# Patient Record
Sex: Male | Born: 1970 | Race: Black or African American | Hispanic: No | Marital: Married | Smoking: Current every day smoker
Health system: Southern US, Community
[De-identification: ages and names within clinical notes are randomized; demographics above are authoritative.]

## PROBLEM LIST (undated history)

## (undated) DIAGNOSIS — E119 Type 2 diabetes mellitus without complications: Secondary | ICD-10-CM

## (undated) DIAGNOSIS — I10 Essential (primary) hypertension: Secondary | ICD-10-CM

## (undated) DIAGNOSIS — E876 Hypokalemia: Secondary | ICD-10-CM

## (undated) DIAGNOSIS — I208 Other forms of angina pectoris: Secondary | ICD-10-CM

## (undated) DIAGNOSIS — Z720 Tobacco use: Secondary | ICD-10-CM

---

## 2002-07-28 ENCOUNTER — Encounter: Admission: RE | Admit: 2002-07-28 | Discharge: 2002-09-15 | Payer: Self-pay | Admitting: Family Medicine

## 2003-11-20 ENCOUNTER — Emergency Department (HOSPITAL_COMMUNITY): Admission: EM | Admit: 2003-11-20 | Discharge: 2003-11-20 | Payer: Self-pay | Admitting: Emergency Medicine

## 2008-04-18 ENCOUNTER — Emergency Department (HOSPITAL_COMMUNITY): Admission: EM | Admit: 2008-04-18 | Discharge: 2008-04-18 | Payer: Self-pay | Admitting: Emergency Medicine

## 2010-04-12 ENCOUNTER — Emergency Department (HOSPITAL_COMMUNITY): Admission: EM | Admit: 2010-04-12 | Discharge: 2010-04-12 | Payer: Self-pay | Admitting: Emergency Medicine

## 2011-02-20 LAB — DIFFERENTIAL
Eosinophils Absolute: 0 10*3/uL (ref 0.0–0.7)
Lymphocytes Relative: 28 % (ref 12–46)
Lymphs Abs: 2.3 10*3/uL (ref 0.7–4.0)
Neutro Abs: 5.2 10*3/uL (ref 1.7–7.7)
Neutrophils Relative %: 64 % (ref 43–77)

## 2011-02-20 LAB — CBC
HCT: 38.3 % — ABNORMAL LOW (ref 39.0–52.0)
Hemoglobin: 13.4 g/dL (ref 13.0–17.0)
MCHC: 34.9 g/dL (ref 30.0–36.0)
MCV: 92.2 fL (ref 78.0–100.0)
RBC: 4.15 MIL/uL — ABNORMAL LOW (ref 4.22–5.81)

## 2011-02-20 LAB — URINALYSIS, ROUTINE W REFLEX MICROSCOPIC
Bilirubin Urine: NEGATIVE
Hgb urine dipstick: NEGATIVE
Nitrite: NEGATIVE
Protein, ur: 30 mg/dL — AB
Urobilinogen, UA: 0.2 mg/dL (ref 0.0–1.0)

## 2011-02-20 LAB — COMPREHENSIVE METABOLIC PANEL
ALT: 37 U/L (ref 0–53)
BUN: 9 mg/dL (ref 6–23)
CO2: 26 mEq/L (ref 19–32)
Calcium: 9.8 mg/dL (ref 8.4–10.5)
Creatinine, Ser: 0.69 mg/dL (ref 0.4–1.5)
GFR calc non Af Amer: >60 mL/min (ref >60)
Glucose, Bld: 116 mg/dL — ABNORMAL HIGH (ref 70–99)

## 2011-02-20 LAB — GLUCOSE, CAPILLARY: Glucose-Capillary: 124 mg/dL — ABNORMAL HIGH (ref 70–99)

## 2011-08-29 LAB — WOUND CULTURE: Gram Stain: NONE SEEN

## 2011-12-01 ENCOUNTER — Emergency Department (HOSPITAL_COMMUNITY): Payer: BC Managed Care – PPO

## 2011-12-01 ENCOUNTER — Encounter: Payer: Self-pay | Admitting: Emergency Medicine

## 2011-12-01 ENCOUNTER — Inpatient Hospital Stay (HOSPITAL_COMMUNITY)
Admission: EM | Admit: 2011-12-01 | Discharge: 2011-12-03 | DRG: 125 | Disposition: A | Discharge status: Home / Self Care | Payer: BC Managed Care – PPO | Attending: Cardiology | Admitting: Cardiology

## 2011-12-01 ENCOUNTER — Other Ambulatory Visit: Payer: Self-pay

## 2011-12-01 DIAGNOSIS — R0789 Other chest pain: Principal | ICD-10-CM | POA: Diagnosis present

## 2011-12-01 DIAGNOSIS — I208 Other forms of angina pectoris: Secondary | ICD-10-CM

## 2011-12-01 DIAGNOSIS — I2089 Other forms of angina pectoris: Secondary | ICD-10-CM | POA: Diagnosis present

## 2011-12-01 DIAGNOSIS — I10 Essential (primary) hypertension: Secondary | ICD-10-CM | POA: Diagnosis present

## 2011-12-01 DIAGNOSIS — E876 Hypokalemia: Secondary | ICD-10-CM

## 2011-12-01 DIAGNOSIS — E119 Type 2 diabetes mellitus without complications: Secondary | ICD-10-CM

## 2011-12-01 DIAGNOSIS — F17200 Nicotine dependence, unspecified, uncomplicated: Secondary | ICD-10-CM | POA: Diagnosis present

## 2011-12-01 DIAGNOSIS — I498 Other specified cardiac arrhythmias: Secondary | ICD-10-CM | POA: Diagnosis present

## 2011-12-01 DIAGNOSIS — E785 Hyperlipidemia, unspecified: Secondary | ICD-10-CM | POA: Diagnosis present

## 2011-12-01 DIAGNOSIS — Z720 Tobacco use: Secondary | ICD-10-CM | POA: Diagnosis present

## 2011-12-01 DIAGNOSIS — R9431 Abnormal electrocardiogram [ECG] [EKG]: Secondary | ICD-10-CM

## 2011-12-01 HISTORY — DX: Hypokalemia: E87.6

## 2011-12-01 HISTORY — DX: Essential (primary) hypertension: I10

## 2011-12-01 HISTORY — DX: Type 2 diabetes mellitus without complications: E11.9

## 2011-12-01 HISTORY — DX: Other forms of angina pectoris: I20.8

## 2011-12-01 HISTORY — DX: Tobacco use: Z72.0

## 2011-12-01 LAB — CBC
Hemoglobin: 13.8 g/dL (ref 13.0–17.0)
MCH: 31.7 pg (ref 26.0–34.0)
MCHC: 36.2 g/dL — ABNORMAL HIGH (ref 30.0–36.0)

## 2011-12-01 LAB — COMPREHENSIVE METABOLIC PANEL
BUN: 9 mg/dL (ref 6–23)
Calcium: 8.8 mg/dL (ref 8.4–10.5)
GFR calc Af Amer: >90 mL/min (ref >90)
Glucose, Bld: 191 mg/dL — ABNORMAL HIGH (ref 70–99)
Total Protein: 8.2 g/dL (ref 6.0–8.3)

## 2011-12-01 LAB — BASIC METABOLIC PANEL
BUN: 9 mg/dL (ref 6–23)
Calcium: 8.6 mg/dL (ref 8.4–10.5)
GFR calc non Af Amer: >90 mL/min (ref >90)
Glucose, Bld: 201 mg/dL — ABNORMAL HIGH (ref 70–99)
Sodium: 133 mEq/L — ABNORMAL LOW (ref 135–145)

## 2011-12-01 LAB — AMYLASE: Amylase: 147 U/L — ABNORMAL HIGH (ref 0–105)

## 2011-12-01 LAB — PROTIME-INR
INR: 0.87 (ref 0.00–1.49)
Prothrombin Time: 12 seconds (ref 11.6–15.2)

## 2011-12-01 LAB — CARDIAC PANEL(CRET KIN+CKTOT+MB+TROPI)
CK, MB: 3.8 ng/mL (ref 0.3–4.0)
Troponin I: <0.3 ng/mL (ref <0.30)
Troponin I: <0.3 ng/mL (ref <0.30)

## 2011-12-01 LAB — HEPARIN LEVEL (UNFRACTIONATED): Heparin Unfractionated: <0.1 IU/mL — ABNORMAL LOW (ref 0.30–0.70)

## 2011-12-01 LAB — POCT I-STAT, CHEM 8
BUN: 10 mg/dL (ref 6–23)
Calcium, Ion: 1.05 mmol/L — ABNORMAL LOW (ref 1.12–1.32)
Creatinine, Ser: 1 mg/dL (ref 0.50–1.35)
TCO2: 24 mmol/L (ref 0–100)

## 2011-12-01 LAB — GLUCOSE, CAPILLARY
Glucose-Capillary: 144 mg/dL — ABNORMAL HIGH (ref 70–99)
Glucose-Capillary: 143 mg/dL — ABNORMAL HIGH (ref 70–99)

## 2011-12-01 LAB — APTT: aPTT: 30 seconds (ref 24–37)

## 2011-12-01 LAB — HEMOGLOBIN A1C: Mean Plasma Glucose: 134 mg/dL — ABNORMAL HIGH (ref <117)

## 2011-12-01 MED ORDER — NITROGLYCERIN 2 % TD OINT
1.0000 [in_us] | TOPICAL_OINTMENT | Freq: Three times a day (TID) | TRANSDERMAL | Status: DC
  Administered 2011-12-01 – 2011-12-03 (×7): 1 [in_us] via TOPICAL
  Filled 2011-12-01: qty 1
  Filled 2011-12-01: qty 30

## 2011-12-01 MED ORDER — POTASSIUM CHLORIDE CRYS ER 20 MEQ PO TBCR
40.0000 meq | EXTENDED_RELEASE_TABLET | Freq: Two times a day (BID) | ORAL | Status: DC
  Administered 2011-12-01 – 2011-12-03 (×5): 40 meq via ORAL
  Filled 2011-12-01 (×6): qty 2

## 2011-12-01 MED ORDER — ZOLPIDEM TARTRATE 5 MG PO TABS: 10.0000 mg | ORAL_TABLET | Freq: Every evening | ORAL | Status: DC | PRN

## 2011-12-01 MED ORDER — LISINOPRIL 20 MG PO TABS
20.0000 mg | ORAL_TABLET | Freq: Every day | ORAL | Status: DC
  Administered 2011-12-01 – 2011-12-03 (×3): 20 mg via ORAL
  Filled 2011-12-01 (×3): qty 1

## 2011-12-01 MED ORDER — ASPIRIN 300 MG RE SUPP
300.0000 mg | RECTAL | Status: AC
  Filled 2011-12-01: qty 1

## 2011-12-01 MED ORDER — ALPRAZOLAM 0.25 MG PO TABS: 0.2500 mg | ORAL_TABLET | Freq: Two times a day (BID) | ORAL | Status: DC | PRN

## 2011-12-01 MED ORDER — INSULIN ASPART 100 UNIT/ML ~~LOC~~ SOLN
0.0000 [IU] | Freq: Three times a day (TID) | SUBCUTANEOUS | Status: DC
  Administered 2011-12-01: 2 [IU] via SUBCUTANEOUS
  Administered 2011-12-01: 3 [IU] via SUBCUTANEOUS
  Administered 2011-12-02: 2 [IU] via SUBCUTANEOUS
  Administered 2011-12-02 – 2011-12-03 (×3): 3 [IU] via SUBCUTANEOUS
  Filled 2011-12-01: qty 3

## 2011-12-01 MED ORDER — HYDROCHLOROTHIAZIDE 25 MG PO TABS
25.0000 mg | ORAL_TABLET | Freq: Every day | ORAL | Status: DC
  Administered 2011-12-01 – 2011-12-03 (×3): 25 mg via ORAL
  Filled 2011-12-01 (×3): qty 1

## 2011-12-01 MED ORDER — METOPROLOL TARTRATE 25 MG PO TABS
25.0000 mg | ORAL_TABLET | Freq: Two times a day (BID) | ORAL | Status: DC
  Filled 2011-12-01 (×2): qty 1

## 2011-12-01 MED ORDER — HEPARIN SOD (PORCINE) IN D5W 100 UNIT/ML IV SOLN
2900.0000 [IU]/h | INTRAVENOUS | Status: DC
  Administered 2011-12-01: 1800 [IU]/h via INTRAVENOUS
  Administered 2011-12-01: 1500 [IU]/h via INTRAVENOUS
  Administered 2011-12-02: 2800 [IU]/h via INTRAVENOUS
  Administered 2011-12-02: 2200 [IU]/h via INTRAVENOUS
  Administered 2011-12-02: 2500 [IU]/h via INTRAVENOUS
  Administered 2011-12-03: 2800 [IU]/h via INTRAVENOUS
  Filled 2011-12-01 (×7): qty 250

## 2011-12-01 MED ORDER — SIMVASTATIN 20 MG PO TABS
20.0000 mg | ORAL_TABLET | Freq: Every day | ORAL | Status: DC
  Administered 2011-12-01 – 2011-12-02 (×2): 20 mg via ORAL
  Filled 2011-12-01 (×3): qty 1

## 2011-12-01 MED ORDER — ACETAMINOPHEN 325 MG PO TABS
650.0000 mg | ORAL_TABLET | ORAL | Status: DC | PRN
  Administered 2011-12-01 – 2011-12-02 (×2): 650 mg via ORAL
  Filled 2011-12-01 (×3): qty 2

## 2011-12-01 MED ORDER — ASPIRIN 81 MG PO CHEW: 324.0000 mg | CHEWABLE_TABLET | ORAL | Status: AC

## 2011-12-01 MED ORDER — METOPROLOL TARTRATE 50 MG PO TABS
50.0000 mg | ORAL_TABLET | Freq: Two times a day (BID) | ORAL | Status: DC
  Administered 2011-12-01 – 2011-12-03 (×5): 50 mg via ORAL
  Filled 2011-12-01 (×6): qty 1

## 2011-12-01 MED ORDER — METOPROLOL TARTRATE 1 MG/ML IV SOLN
5.0000 mg | Freq: Once | INTRAVENOUS | Status: AC
  Administered 2011-12-01: 5 mg via INTRAVENOUS
  Filled 2011-12-01: qty 5

## 2011-12-01 MED ORDER — HEPARIN BOLUS VIA INFUSION
4000.0000 [IU] | Freq: Once | INTRAVENOUS | Status: AC
  Administered 2011-12-01: 4000 [IU] via INTRAVENOUS
  Filled 2011-12-01: qty 4000

## 2011-12-01 MED ORDER — ONDANSETRON HCL 4 MG/2ML IJ SOLN
4.0000 mg | Freq: Four times a day (QID) | INTRAMUSCULAR | Status: DC | PRN
  Administered 2011-12-01: 4 mg via INTRAVENOUS
  Filled 2011-12-01: qty 2

## 2011-12-01 MED ORDER — ASPIRIN 81 MG PO CHEW
324.0000 mg | CHEWABLE_TABLET | Freq: Once | ORAL | Status: AC
  Administered 2011-12-01: 324 mg via ORAL
  Filled 2011-12-01: qty 4

## 2011-12-01 MED ORDER — HEPARIN BOLUS VIA INFUSION
3000.0000 [IU] | Freq: Once | INTRAVENOUS | Status: AC
  Administered 2011-12-01: 3000 [IU] via INTRAVENOUS
  Filled 2011-12-01: qty 3000

## 2011-12-01 MED ORDER — NITROGLYCERIN 0.4 MG SL SUBL: 0.4000 mg | SUBLINGUAL_TABLET | SUBLINGUAL | Status: DC | PRN

## 2011-12-01 MED ORDER — LISINOPRIL-HYDROCHLOROTHIAZIDE 20-25 MG PO TABS: 1.0000 | ORAL_TABLET | Freq: Every day | ORAL | Status: DC

## 2011-12-01 MED ORDER — ASPIRIN EC 81 MG PO TBEC
81.0000 mg | DELAYED_RELEASE_TABLET | Freq: Every day | ORAL | Status: DC
  Administered 2011-12-02 – 2011-12-03 (×2): 81 mg via ORAL
  Filled 2011-12-01 (×2): qty 1

## 2011-12-01 MED ORDER — SODIUM CHLORIDE 0.9 % IV SOLN
INTRAVENOUS | Status: DC
  Administered 2011-12-01 – 2011-12-02 (×3): via INTRAVENOUS

## 2011-12-01 MED ORDER — NITROGLYCERIN 0.4 MG SL SUBL
0.4000 mg | SUBLINGUAL_TABLET | SUBLINGUAL | Status: DC | PRN
  Filled 2011-12-01: qty 75

## 2011-12-01 MED ORDER — IBUPROFEN 400 MG PO TABS
400.0000 mg | ORAL_TABLET | Freq: Three times a day (TID) | ORAL | Status: DC | PRN
Start: 2011-12-01 — End: 2011-12-03
  Administered 2011-12-01: 400 mg via ORAL
  Filled 2011-12-01: qty 1

## 2011-12-01 NOTE — Progress Notes (Signed)
ANTICOAGULATION CONSULT NOTE - Initial Consult  Pharmacy Consult for Heparin Indication: ACS  No Known Allergies  Patient Measurements: 71 inches 237 pounds  Vital Signs: Temp: 98.2 F (36.8 C) (12/29 0545) Temp src: Oral (12/29 0545) BP: 136/84 mmHg (12/29 0551) Pulse Rate: 111  (12/29 0551)  Labs:  Basename 12/01/11 0424 12/01/11 0406  HGB 13.9 13.8  HCT 41.0 38.1*  PLT -- 258  APTT -- --  LABPROT -- --  INR -- --  HEPARINUNFRC -- --  CREATININE 1.00 0.60  CKTOTAL -- --  CKMB -- --  TROPONINI -- --   CrCl is unknown because there is no height on file for the current visit.  Medical History: Past Medical History  Diagnosis Date  . Hypertension   . Diabetes mellitus   . HTN (hypertension) 12/01/2011  . DM (diabetes mellitus) type 2 12/01/2011  . Tobacco abuse 12/01/2011    Medications:  Zestoretic  Metformin  Assessment: 40 yo male with chest pain for Heparin  Goal of Therapy:  Heparin level 0.3-0.7 units/ml   Plan:  Heparin 4000 units IV bolus, then 1500 units/hr Check heparin level in 6 hours.   Kyle Weiss 12/01/2011,7:24 AM

## 2011-12-01 NOTE — ED Notes (Signed)
Patient transported to X-ray 

## 2011-12-01 NOTE — H&P (Signed)
Kyle Weiss is an 40 y.o. male.   Chief Complaint: chest pain HPI: 40 year old male patient of Dr. Alwyn Ren and urgent care presents to the emergency room in the early hours of today secondary to chest pain. This discomfort started on Thursday, 11/29/2011 left anterior chest pain, more sharp, significant pain. It improved on Thursday but by Friday initially in the evening he felt fullness in his head and neck about 1:30 in the morning he developed chest pain left anterior and without radiation he had no nausea mild shortness of breath and he was diaphoretic. The pain was significant  Enough to cause him to sit up in bed. He woke his wife who brought him to the emergency room. Here in the emergency room the pain ease off of oxygen and aspirin. Currently he is pain-free.  He also related that he felt his heart was racing earlier this morning. Currently his heart rate is 112.  EKG reveals sinus tachycardia with a heart rate of 112 small Q waves in 2, 3, AVF in poor R wave progression through V4. Comparing EKG to one done 04/12/2010 Q waves in 2, 3 and AVF are deeper but poor R wave progression is new.  Patient has no known coronary artery disease but does have a history of hypertension diabetes mellitus type 2 and strong family history of premature coronary artery disease. He has unknown lipid status. Also patient has been off his hypertensive and diabetic medications for approximately 2 months he had run out and not gone back to the physician.  Past Medical History  Diagnosis Date  . Hypertension   . Diabetes mellitus   . HTN (hypertension) 12/01/2011  . DM (diabetes mellitus) type 2 12/01/2011  . Tobacco abuse 12/01/2011    History reviewed. No pertinent past surgical history.  Family History  Problem Relation Age of Onset  . Hypertension Mother   . Diabetes type II Mother   . Stroke Mother   . Coronary artery disease Father   . Hypertension Father   . Diabetes type II Father   . Stroke  Father   . Coronary artery disease Sister   . Hypertension Sister   . Diabetes type II Sister   . Cancer Brother   . Hypertension Sister   . Diabetes type II Sister   . Hypertension Brother   . Diabetes type II Brother   . Hypertension Brother   . Diabetes type II Brother    Social History:  reports that he has been smoking Cigars.  He has never used smokeless tobacco. He reports that he drinks about 11.2 ounces of alcohol per week. He reports that he does not use illicit drugs. Married, 3 children, one grandchild. 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 Allergies: No Known Allergies  Medications Prior to Admission  Medication Dose Route Frequency Provider Last Rate Last Dose  . aspirin chewable tablet 324 mg  324 mg Oral Once April K Palumbo-Rasch, MD   324 mg at 12/01/11 0554  . metoprolol (LOPRESSOR) injection 5 mg  5 mg Intravenous Once Leone Brand, NP      . nitroGLYCERIN (NITROGLYN) 2 % ointment 1 inch  1 inch Topical Q8H Leone Brand, NP      . potassium chloride SA (K-DUR,KLOR-CON) CR tablet 40 mEq  40 mEq Oral BID Leone Brand, NP      . DISCONTD: nitroGLYCERIN (NITROSTAT) SL tablet 0.4 mg  0.4 mg Sublingual  Q5 min PRN April K Palumbo-Rasch, MD       No current outpatient prescriptions on file as of 12/01/2011.  Outpatient Medications: 1. Lisinopril HCTZ 20/25 one daily none for 2 months 2. Metformin 500 mg 2 every morning, none for 2 months  Results for orders placed during the hospital encounter of 12/01/11 (from the past 48 hour(s))  CBC     Status: Abnormal   Collection Time   12/01/11  4:06 AM      Component Value Range Comment   WBC 6.6  4.0 - 10.5 (K/uL)    RBC 4.36  4.22 - 5.81 (MIL/uL)    Hemoglobin 13.8  13.0 - 17.0 (g/dL)    HCT 65.7 (*) 84.6 - 52.0 (%)    MCV 87.4  78.0 - 100.0 (fL)    MCH 31.7  26.0 - 34.0 (pg)    MCHC 36.2 (*) 30.0 - 36.0 (g/dL)    RDW 96.2  95.2 - 84.1 (%)    Platelets 258  150 - 400  (K/uL)   BASIC METABOLIC PANEL     Status: Abnormal   Collection Time   12/01/11  4:06 AM      Component Value Range Comment   Sodium 133 (*) 135 - 145 (mEq/L)    Potassium 3.2 (*) 3.5 - 5.1 (mEq/L)    Chloride 97  96 - 112 (mEq/L)    CO2 21  19 - 32 (mEq/L)    Glucose, Bld 201 (*) 70 - 99 (mg/dL)    BUN 9  6 - 23 (mg/dL)    Creatinine, Ser 3.24  0.50 - 1.35 (mg/dL)    Calcium 8.6  8.4 - 10.5 (mg/dL)    GFR calc non Af Amer >90  >90 (mL/min)    GFR calc Af Amer >90  >90 (mL/min)   POCT I-STAT TROPONIN I     Status: Normal   Collection Time   12/01/11  4:15 AM      Component Value Range Comment   Troponin i, poc 0.03  0.00 - 0.08 (ng/mL)    Comment 3            POCT I-STAT, CHEM 8     Status: Abnormal   Collection Time   12/01/11  4:24 AM      Component Value Range Comment   Sodium 137  135 - 145 (mEq/L)    Potassium 3.3 (*) 3.5 - 5.1 (mEq/L)    Chloride 106  96 - 112 (mEq/L)    BUN 10  6 - 23 (mg/dL)    Creatinine, Ser 4.01  0.50 - 1.35 (mg/dL)    Glucose, Bld 027 (*) 70 - 99 (mg/dL)    Calcium, Ion 2.53 (*) 1.12 - 1.32 (mmol/L)    TCO2 24  0 - 100 (mmol/L)    Hemoglobin 13.9  13.0 - 17.0 (g/dL)    HCT 66.4  40.3 - 47.4 (%)    Dg Chest 2 View  12/01/2011  *RADIOLOGY REPORT*  Clinical Data: Chest pain and night sweats  CHEST - 2 VIEW  Comparison: None.  Findings: Borderline heart size with normal pulmonary vascularity. No focal airspace consolidation in the lungs.  No blunting of costophrenic angles.  No pneumothorax.  Hila appear symmetrical.  IMPRESSION: No evidence of active pulmonary disease.  Original Report Authenticated By: Marlon Pel, M.D.    ROSDicky Doe.: No colds or fevers Skin: no rashes or ulcers HEENT: No blurred vision no toothaches. Cardiovascular: See history of  present illness Pulmonary: Denies shortness of breath does use tobacco and scar form G:I no diarrhea constipation or melena no indigestion GU: No hematuria or dysuria Neuro: No  syncope Endocrine: Positive for diabetes does not do glucose checks at home, has been out of medication for 2 months,   unknown if thyroid disease  Blood pressure 136/84, pulse 111, temperature 98.2 F (36.8 C), temperature source Oral, resp. rate 20, SpO2 98.00%. PE: Gen.: Alert oriented African American male in no acute distress pleasant affect. Skin: warm and dry brisk capillary refill.  HEENT: Normocephalic, sclera injected. Neck: Supple no JVD no bruits 2+ carotid upstroke. Heart: S1-S2 regular rate and rhythm somewhat tachycardic no obvious murmur gallop rub or click. Lungs: Clear without rales rhonchi or wheezes. Abdomen: Soft nontender positive bowel sounds do not palpate liver spleen or masses. Extremities: No lower extremity edema 2+ pedal pulses bilaterally 2+ radial pulses bilaterally. Neuro: Alert oriented x3 follows commands and moves all extremities.  Assessment/Plan Patient Active Problem List  Diagnoses  . Angina at rest  . HTN (hypertension)  . DM (diabetes mellitus) type 2  . Tobacco abuse   PLAN:  Admit to telemetry, serial cardiac enzymes. IV heparin. Currently patient is pain-free we'll add nitroglycerin paste only. For his hypertension we'll give IV Lopressor now and then by mouth Lopressor twice a day. We'll add sliding scale insulin for diabetes control now and check a hemoglobin A1c. Prior to discharge he will need to go back on his metformin. He Will need tobacco cessation counseling. Plan will be for either cardiac catheterization depending on cardiac enzymes versus a nuclear stress test. Dr. Herbie Baltimore we'll see him today. Patient and his wife are agreeable to this approach.  INGOLD,LAURA R 12/01/2011, 7:26 AM      Patient seen and examined. Agree with assessment and plan.  Very pleasant 40 yo AA male with cardiac risk factors including FH, DM, HTN, lipid elevation and tobacco history.  He has noticed exertional SOB for the last 1-2 months which has  increased.  He has noticed increased pulse rate and has developed recurrent CP nocturnally.  No acute changes on ECG. In light of risk factors and progressive symptoms, I have recommended definitive cardiac catheterization to delineate coronary anatomy.  Will further increase beta-blocker for improved rate control.  Lennette Bihari, MD, [facility] 12/01/2011 9:21 AM

## 2011-12-01 NOTE — ED Notes (Signed)
Family at bedside. 

## 2011-12-01 NOTE — Progress Notes (Signed)
ANTICOAGULATION CONSULT NOTE - Follow Up Consult  Pharmacy Consult for Heparin Indication: chest pain/ACS  No Known Allergies  Patient Measurements: Height: 5\' 11"  (180.3 cm) Weight: 216 lb 14.9 oz (98.4 kg) IBW/kg (Calculated) : 75.3  Adjusted Body Weight:    Vital Signs: Temp: 97.5 F (36.4 C) (12/29 1500) Temp src: Oral (12/29 1500) BP: 145/85 mmHg (12/29 1500) Pulse Rate: 77  (12/29 1500)  Labs:  Basename 12/01/11 1425 12/01/11 0718 12/01/11 0424 12/01/11 0406  HGB -- -- 13.9 13.8  HCT -- -- 41.0 38.1*  PLT -- -- -- 258  APTT -- 30 -- --  LABPROT -- 12.0 -- --  INR -- 0.87 -- --  HEPARINUNFRC <0.10* -- -- --  CREATININE -- 0.63 1.00 0.60  CKTOTAL -- 419* -- --  CKMB -- 3.8 -- --  TROPONINI -- <0.30 -- --   Estimated Creatinine Clearance: 143.8 ml/min (by C-G formula based on Cr of 0.63).   Medications:  Prescriptions prior to admission  Medication Sig Dispense Refill  . lisinopril-hydrochlorothiazide (PRINZIDE,ZESTORETIC) 20-25 MG per tablet Take 1 tablet by mouth daily.        . metFORMIN (GLUCOPHAGE) 500 MG tablet Take 500 mg by mouth.          Assessment: Admit Complaint: Awakened by CP Anticoagulation: Heparin level <0.1.  Cardiovascular: HTN.Family h/o premature CAD. Ran out of meds 2 months ago. Meds: HCTZ, lisinopril, metoprolol, NTG oint.,K,simvastatin  Endocrinology: DM. CBG 124-144 on SSI  Nephrology:ok   Goal of Therapy:  Heparin level 0.3-0.7 units/ml   Plan:  Rebolus heparin 3000 units and increase infusion to 1800 units/hr. Recheck level in 6-8 hrs.  Merilynn Finland, [employer] 12/01/2011,3:37 PM

## 2011-12-01 NOTE — ED Notes (Signed)
Patient is resting comfortably. 

## 2011-12-01 NOTE — ED Provider Notes (Signed)
History     CSN: [id number]  Arrival date & time 12/01/11  4098   First MD Initiated Contact with Patient 12/01/11 0407      Chief Complaint  Patient presents with  . Chest Pain    (Consider location/radiation/quality/duration/timing/severity/associated sxs/prior treatment) Patient is a 40 y.o. male presenting with chest pain. The history is provided by the patient and the spouse. No language interpreter was used.  Chest Pain The chest pain began 2 days ago. Duration of episode(s) is 3 hours. Chest pain occurs intermittently. The chest pain is improving. Associated with: nothing at rest. At its most intense, the pain is at 9/10. The pain is currently at 2/10. The quality of the pain is described as pressure-like. The pain radiates to the left neck and right neck. Exacerbated by: nothing. Pertinent negatives for primary symptoms include no syncope, no shortness of breath, no cough, no wheezing, no abdominal pain, no nausea and no vomiting.  Associated symptoms include diaphoresis. He tried nothing for the symptoms. Risk factors include male gender (diabetes ).  His past medical history is significant for diabetes, hyperlipidemia and hypertension.  His family medical history is significant for early MI in family.  Procedure history is negative for cardiac catheterization. Procedure history comments: thinks he had a stress test.   Thursday night awoke from sleep and lasted 3 hours.  Diaphoresis but no radiation at that time.  Friday night awoke with CP, radiation and diaphoresis but no SOB, no n/v.  PERC negative.    Past Medical History  Diagnosis Date  . Hypertension   . Diabetes mellitus     History reviewed. No pertinent past surgical history.  History reviewed. No pertinent family history.  History  Substance Use Topics  . Smoking status: Current Everyday Smoker    Types: Cigars  . Smokeless tobacco: Not on file  . Alcohol Use: Yes      Review of Systems    Constitutional: Positive for diaphoresis.  HENT: Negative for facial swelling.   Eyes: Negative for discharge.  Respiratory: Negative for cough, shortness of breath and wheezing.   Cardiovascular: Positive for chest pain. Negative for syncope.  Gastrointestinal: Negative.  Negative for nausea, vomiting and abdominal pain.  Genitourinary: Negative for difficulty urinating.  Musculoskeletal: Negative for arthralgias.  Skin: Negative.   Neurological: Negative.   Hematological: Negative.   Psychiatric/Behavioral: Negative.     Allergies  Review of patient's allergies indicates no known allergies.  Home Medications   Current Outpatient Rx  Name Route Sig Dispense Refill  . LISINOPRIL-HYDROCHLOROTHIAZIDE 20-25 MG PO TABS Oral Take 1 tablet by mouth daily.      Marland Kitchen METFORMIN HCL 500 MG PO TABS Oral Take 500 mg by mouth.        BP 136/84  Pulse 111  Temp(Src) 98.2 F (36.8 C) (Oral)  Resp 20  SpO2 98%  Physical Exam  Constitutional: He is oriented to person, place, and time. He appears well-developed and well-nourished.  HENT:  Head: Normocephalic and atraumatic.  Mouth/Throat: Oropharynx is clear and moist.  Eyes: Conjunctivae and EOM are normal. Pupils are equal, round, and reactive to light.  Neck: Normal range of motion. Neck supple.  Cardiovascular: Normal rate and regular rhythm.   Pulmonary/Chest: Effort normal and breath sounds normal. He has no wheezes.  Abdominal: Soft. Bowel sounds are normal. There is no tenderness.  Musculoskeletal: Normal range of motion.  Neurological: He is alert and oriented to person, place, and time.  Skin: Skin is  warm and dry. He is not diaphoretic.  Psychiatric: Thought content normal.    ED Course  Procedures (including critical care time)  Labs Reviewed  CBC - Abnormal; Notable for the following:    HCT 38.1 (*)    MCHC 36.2 (*)    All other components within normal limits  BASIC METABOLIC PANEL - Abnormal; Notable for the  following:    Sodium 133 (*)    Potassium 3.2 (*)    Glucose, Bld 201 (*)    All other components within normal limits  POCT I-STAT, CHEM 8 - Abnormal; Notable for the following:    Potassium 3.3 (*)    Glucose, Bld 209 (*)    Calcium, Ion 1.05 (*)    All other components within normal limits  POCT I-STAT TROPONIN I  I-STAT TROPONIN I  I-STAT, CHEM 8   Dg Chest 2 View  12/01/2011  *RADIOLOGY REPORT*  Clinical Data: Chest pain and night sweats  CHEST - 2 VIEW  Comparison: None.  Findings: Borderline heart size with normal pulmonary vascularity. No focal airspace consolidation in the lungs.  No blunting of costophrenic angles.  No pneumothorax.  Hila appear symmetrical.  IMPRESSION: No evidence of active pulmonary disease.  Original Report Authenticated By: Marlon Pel, M.D.     1. Chest pain   2. Abnormal EKG      MDM Reviewed: previous chart and vitals Interpretation: labs, ECG and x-ray Consults: cardiology    MDM   Date: 12/01/2011  Rate:112  Rhythm: sinus tachycardia  QRS Axis: normal  Intervals: normal  ST/T Wave abnormalities: nonspecific ST changes  Conduction Disutrbances:none  Narrative Interpretation: poor r wave progression IMI age undetermined  Old EKG Reviewed: changes noted   Results for orders placed during the hospital encounter of 12/01/11  CBC      Component Value Range   WBC 6.6  4.0 - 10.5 (K/uL)   RBC 4.36  4.22 - 5.81 (MIL/uL)   Hemoglobin 13.8  13.0 - 17.0 (g/dL)   HCT 16.1 (*) 09.6 - 52.0 (%)   MCV 87.4  78.0 - 100.0 (fL)   MCH 31.7  26.0 - 34.0 (pg)   MCHC 36.2 (*) 30.0 - 36.0 (g/dL)   RDW 04.5  40.9 - 81.1 (%)   Platelets 258  150 - 400 (K/uL)  BASIC METABOLIC PANEL      Component Value Range   Sodium 133 (*) 135 - 145 (mEq/L)   Potassium 3.2 (*) 3.5 - 5.1 (mEq/L)   Chloride 97  96 - 112 (mEq/L)   CO2 21  19 - 32 (mEq/L)   Glucose, Bld 201 (*) 70 - 99 (mg/dL)   BUN 9  6 - 23 (mg/dL)   Creatinine, Ser 9.14  0.50 - 1.35  (mg/dL)   Calcium 8.6  8.4 - 78.2 (mg/dL)   GFR calc non Af Amer >90  >90 (mL/min)   GFR calc Af Amer >90  >90 (mL/min)  POCT I-STAT TROPONIN I      Component Value Range   Troponin i, poc 0.03  0.00 - 0.08 (ng/mL)   Comment 3           POCT I-STAT, CHEM 8      Component Value Range   Sodium 137  135 - 145 (mEq/L)   Potassium 3.3 (*) 3.5 - 5.1 (mEq/L)   Chloride 106  96 - 112 (mEq/L)   BUN 10  6 - 23 (mg/dL)   Creatinine, Ser 9.56  0.50 - 1.35 (mg/dL)   Glucose, Bld 956 (*) 70 - 99 (mg/dL)   Calcium, Ion 2.13 (*) 1.12 - 1.32 (mmol/L)   TCO2 24  0 - 100 (mmol/L)   Hemoglobin 13.9  13.0 - 17.0 (g/dL)   HCT 08.6  57.8 - 46.9 (%)   Dg Chest 2 View  12/01/2011  *RADIOLOGY REPORT*  Clinical Data: Chest pain and night sweats  CHEST - 2 VIEW  Comparison: None.  Findings: Borderline heart size with normal pulmonary vascularity. No focal airspace consolidation in the lungs.  No blunting of costophrenic angles.  No pneumothorax.  Hila appear symmetrical.  IMPRESSION: No evidence of active pulmonary disease.  Original Report Authenticated By: Marlon Pel, M.D.          Jasmine Awe, MD 12/01/11 [phone]

## 2011-12-01 NOTE — ED Notes (Signed)
PT. REPORTS SUBSTERNAL CHEST PAIN WORSE WHEN LYING ONSET YESTERDAY , DENIES SOB ,NAUSEA OR DIAPHORESIS.  NO COUGH OR CONGESTION .

## 2011-12-02 ENCOUNTER — Other Ambulatory Visit: Payer: Self-pay

## 2011-12-02 LAB — BASIC METABOLIC PANEL
BUN: 7 mg/dL (ref 6–23)
CO2: 22 mEq/L (ref 19–32)
Calcium: 9.3 mg/dL (ref 8.4–10.5)
Creatinine, Ser: 0.62 mg/dL (ref 0.50–1.35)
Glucose, Bld: 183 mg/dL — ABNORMAL HIGH (ref 70–99)
Sodium: 133 mEq/L — ABNORMAL LOW (ref 135–145)

## 2011-12-02 LAB — CBC
HCT: 37 % — ABNORMAL LOW (ref 39.0–52.0)
Hemoglobin: 13.2 g/dL (ref 13.0–17.0)
WBC: 9.8 10*3/uL (ref 4.0–10.5)

## 2011-12-02 LAB — GLUCOSE, CAPILLARY: Glucose-Capillary: 188 mg/dL — ABNORMAL HIGH (ref 70–99)

## 2011-12-02 LAB — CARDIAC PANEL(CRET KIN+CKTOT+MB+TROPI)
CK, MB: 2.9 ng/mL (ref 0.3–4.0)
CK, MB: 2.5 ng/mL (ref 0.3–4.0)
Relative Index: 1.1 (ref 0.0–2.5)
Relative Index: 1.1 (ref 0.0–2.5)
Total CK: 273 U/L — ABNORMAL HIGH (ref 7–232)
Total CK: 220 U/L (ref 7–232)
Troponin I: <0.3 ng/mL (ref <0.30)
Troponin I: <0.3 ng/mL (ref <0.30)

## 2011-12-02 LAB — HEPARIN LEVEL (UNFRACTIONATED)
Heparin Unfractionated: <0.1 IU/mL — ABNORMAL LOW (ref 0.30–0.70)
Heparin Unfractionated: <0.1 IU/mL — ABNORMAL LOW (ref 0.30–0.70)

## 2011-12-02 LAB — LIPID PANEL
Cholesterol: 294 mg/dL — ABNORMAL HIGH (ref 0–200)
HDL: 47 mg/dL (ref >39)

## 2011-12-02 LAB — LIPASE, BLOOD: Lipase: 31 U/L (ref 11–59)

## 2011-12-02 MED ORDER — SODIUM CHLORIDE 0.9 % IV SOLN: 250.0000 mL | INTRAVENOUS | Status: DC | PRN

## 2011-12-02 MED ORDER — SODIUM CHLORIDE 0.9 % IJ SOLN
3.0000 mL | Freq: Two times a day (BID) | INTRAMUSCULAR | Status: DC
  Administered 2011-12-02: 3 mL via INTRAVENOUS

## 2011-12-02 MED ORDER — HEPARIN BOLUS VIA INFUSION
4000.0000 [IU] | Freq: Once | INTRAVENOUS | Status: AC
  Administered 2011-12-02: 4000 [IU] via INTRAVENOUS
  Filled 2011-12-02: qty 4000

## 2011-12-02 MED ORDER — HEPARIN BOLUS VIA INFUSION
3000.0000 [IU] | Freq: Once | INTRAVENOUS | Status: AC
  Administered 2011-12-02: 3000 [IU] via INTRAVENOUS
  Filled 2011-12-02: qty 3000

## 2011-12-02 MED ORDER — ASPIRIN 81 MG PO CHEW
324.0000 mg | CHEWABLE_TABLET | ORAL | Status: AC
  Administered 2011-12-03: 324 mg via ORAL
  Filled 2011-12-02: qty 4

## 2011-12-02 MED ORDER — SODIUM CHLORIDE 0.9 % IV SOLN
1.0000 mL/kg/h | INTRAVENOUS | Status: DC
Start: 2011-12-03 — End: 2011-12-03

## 2011-12-02 MED ORDER — DIAZEPAM 5 MG PO TABS
5.0000 mg | ORAL_TABLET | ORAL | Status: AC
  Administered 2011-12-03: 5 mg via ORAL
  Filled 2011-12-02: qty 1

## 2011-12-02 MED ORDER — SODIUM CHLORIDE 0.9 % IJ SOLN: 3.0000 mL | INTRAMUSCULAR | Status: DC | PRN

## 2011-12-02 NOTE — Progress Notes (Signed)
ANTICOAGULATION CONSULT NOTE - Follow Up Consult  Pharmacy Consult for Heparin Indication: chest pain/ACS  No Known Allergies  Patient Measurements: Height: 5\' 11"  (180.3 cm) Weight: 215 lb 13.3 oz (97.9 kg) IBW/kg (Calculated) : 75.3  Adjusted Body Weight:    Vital Signs: Temp: 98 F (36.7 C) (12/30 0533) Temp src: Oral (12/30 0533) BP: 128/66 mmHg (12/30 0954) Pulse Rate: 76  (12/30 0954)  Labs:  Basename 12/02/11 0957 12/02/11 0825 12/02/11 0815 12/01/11 2333 12/01/11 1425 12/01/11 0718 12/01/11 0424 12/01/11 0406  HGB -- -- -- -- -- -- 13.9 13.8  HCT -- -- -- -- -- -- 41.0 38.1*  PLT -- -- -- -- -- -- -- 258  APTT -- -- -- -- -- 30 -- --  LABPROT -- -- -- -- -- 12.0 -- --  INR -- -- -- -- -- 0.87 -- --  HEPARINUNFRC <0.10* -- -- <0.10* <0.10* -- -- --  CREATININE -- -- 0.62 -- -- 0.63 1.00 --  CKTOTAL -- 240* -- 273* 304* -- -- --  CKMB -- 2.7 -- 2.9 3.4 -- -- --  TROPONINI -- <0.30 -- <0.30 <0.30 -- -- --   Estimated Creatinine Clearance: 143.4 ml/min (by C-G formula based on Cr of 0.62).   Medications:  Prescriptions prior to admission  Medication Sig Dispense Refill  . lisinopril-hydrochlorothiazide (PRINZIDE,ZESTORETIC) 20-25 MG per tablet Take 1 tablet by mouth daily.        . metFORMIN (GLUCOPHAGE) 500 MG tablet Take 500 mg by mouth.          Assessment: Anticoagulation Heparin started for ACS. Hep level remain <0.1. No CBC noted today.  Cardiovascular h/o HTN Cath on Monday. Max BP 145/85 with HR 72-100.  Meds: ASA, HCTZ, lisinopril, metoprolol, NTG ointment, K, simvastatin.  Endocrinology h/o DM. Hgb A1C good 6.3. CBGs 122-183 on SSI.  Nephrology: Scr 0.62  Goal of Therapy:  Heparin level 0.3-0.7 units/ml   Plan:  Heparin 3000 unit bolus and increase infusion to 2500 units/hr. Recheck heparin level in 6 hrs.  Misty Stanley Stillinger 12/02/2011,12:09 PM

## 2011-12-02 NOTE — Progress Notes (Signed)
ANTICOAGULATION CONSULT NOTE - Follow Up Consult  Pharmacy Consult for Heparin Indication: chest pain/ACS  No Known Allergies  Patient Measurements: Height: 5\' 11"  (180.3 cm) Weight: 216 lb 14.9 oz (98.4 kg) IBW/kg (Calculated) : 75.3   Vital Signs: Temp: 98 F (36.7 C) (12/29 2036) Temp src: Oral (12/29 2036) BP: 129/70 mmHg (12/29 2036) Pulse Rate: 85  (12/29 2036)  Labs:  Basename 12/01/11 2333 12/01/11 1425 12/01/11 0718 12/01/11 0424 12/01/11 0406  HGB -- -- -- 13.9 13.8  HCT -- -- -- 41.0 38.1*  PLT -- -- -- -- 258  APTT -- -- 30 -- --  LABPROT -- -- 12.0 -- --  INR -- -- 0.87 -- --  HEPARINUNFRC <0.10* <0.10* -- -- --  CREATININE -- -- 0.63 1.00 0.60  CKTOTAL -- 304* 419* -- --  CKMB -- 3.4 3.8 -- --  TROPONINI -- <0.30 <0.30 -- --   Estimated Creatinine Clearance: 143.8 ml/min (by C-G formula based on Cr of 0.63).  Assessment: 40 yo male with chest pain for Heparin  Goal of Therapy:  Heparin level 0.3-0.7 units/ml   Plan:  Rebolus heparin 3000 units and increase infusion to 2200 units/hr. Recheck level in 6 hrs.  Roman Dubuc, Gary Fleet 12/02/2011,2:56 AM

## 2011-12-02 NOTE — Progress Notes (Signed)
ANTICOAGULATION CONSULT NOTE - Follow Up Consult  Pharmacy Consult for Heparin Indication: chest pain/ACS  No Known Allergies  Patient Measurements: Height: 5\' 11"  (180.3 cm) Weight: 215 lb 13.3 oz (97.9 kg) IBW/kg (Calculated) : 75.3  Adjusted Body Weight:    Vital Signs: Temp: 97.8 F (36.6 C) (12/30 1500) Temp src: Oral (12/30 1500) BP: 138/92 mmHg (12/30 1500) Pulse Rate: 79  (12/30 1500)  Labs:  Basename 12/02/11 1801 12/02/11 1454 12/02/11 0957 12/02/11 0825 12/02/11 0815 12/01/11 2333 12/01/11 0718 12/01/11 0424 12/01/11 0406  HGB 13.2 -- -- -- -- -- -- 13.9 --  HCT 37.0* -- -- -- -- -- -- 41.0 38.1*  PLT 273 -- -- -- -- -- -- -- 258  APTT -- -- -- -- -- -- 30 -- --  LABPROT -- -- -- -- -- -- 12.0 -- --  INR -- -- -- -- -- -- 0.87 -- --  HEPARINUNFRC <0.10* -- <0.10* -- -- <0.10* -- -- --  CREATININE -- -- -- -- 0.62 -- 0.63 1.00 --  CKTOTAL -- 220 -- 240* -- 273* -- -- --  CKMB -- 2.5 -- 2.7 -- 2.9 -- -- --  TROPONINI -- <0.30 -- <0.30 -- <0.30 -- -- --   Estimated Creatinine Clearance: 143.4 ml/min (by C-G formula based on Cr of 0.62).   Medications:  Prescriptions prior to admission  Medication Sig Dispense Refill  . lisinopril-hydrochlorothiazide (PRINZIDE,ZESTORETIC) 20-25 MG per tablet Take 1 tablet by mouth daily.        . metFORMIN (GLUCOPHAGE) 500 MG tablet Take 500 mg by mouth.          Assessment: Anticoagulation Heparin started for ACS. Hep level remain <0.1.   Goal of Therapy:  Heparin level 0.3-0.7 units/ml   Plan:  Heparin 4000 unit bolus and increase infusion to 2800 units/hr. Recheck heparin level in 6 hrs.  Merilynn Finland, [employer] 12/02/2011,7:12 PM

## 2011-12-02 NOTE — Progress Notes (Signed)
Subjective: No SOB, No chest Pain.  CK elevated but not MB or Troponin. Abnormal amylase on admit, have ordered one for this am? In process  Objective: Vital signs in last 24 hours: Temp:  [97.5 F (36.4 C)-98 F (36.7 C)] 98 F (36.7 C) (12/30 0533) Pulse Rate:  [72-100] 72  (12/30 0533) Resp:  [18-19] 18  (12/30 0533) BP: (129-145)/(70-85) 134/80 mmHg (12/30 0533) SpO2:  [94 %-98 %] 98 % (12/30 0533) Weight:  [97.9 kg (215 lb 13.3 oz)-98.4 kg (216 lb 14.9 oz)] 215 lb 13.3 oz (97.9 kg) (12/30 4696) Weight change:  Last BM Date: 11/30/11 Intake/Output from previous day:   Intake/Output this shift:    PE: General: A&O X 3 Pleasant affect Heart:S1S2 RRR, no mumur gallop rub or click. Lungs:clear without rales, rhonchi or wheezes Abd:+ BS soft, non tender. Ext:no edema, 2+ pedal pulses.  Labs pending.   Lab Results:  Basename 12/01/11 0424 12/01/11 0406  WBC -- 6.6  HGB 13.9 13.8  HCT 41.0 38.1*  PLT -- 258   BMET  Basename 12/01/11 0718 12/01/11 0424 12/01/11 0406  NA 138 137 --  K 3.8 3.3* --  CL 98 106 --  CO2 22 -- 21  GLUCOSE 191* 209* --  BUN 9 10 --  CREATININE 0.63 1.00 --  CALCIUM 8.8 -- 8.6    Basename 12/01/11 2333 12/01/11 1425  TROPONINI <0.30 <0.30    No results found for this basename: CHOL, HDL, LDLCALC, LDLDIRECT, TRIG, CHOLHDL   Lab Results  Component Value Date   HGBA1C 6.3* 12/01/2011     Lab Results  Component Value Date   TSH 1.973 12/01/2011    Hepatic Function Panel  Basename 12/01/11 0718  PROT 8.2  ALBUMIN 3.9  AST 80*  ALT 61*  ALKPHOS 80  BILITOT 0.3  BILIDIR --  IBILI --   No results found for this basename: CHOL in the last 72 hours No results found for this basename: PROTIME in the last 72 hours    EKG: Orders placed during the hospital encounter of 12/01/11  . ED EKG  . ED EKG  . EKG 12-LEAD  . EKG 12-LEAD  . EKG 12-LEAD    Studies/Results: Dg Chest 2 View  12/01/2011  *RADIOLOGY REPORT*   Clinical Data: Chest pain and night sweats  CHEST - 2 VIEW  Comparison: None.  Findings: Borderline heart size with normal pulmonary vascularity. No focal airspace consolidation in the lungs.  No blunting of costophrenic angles.  No pneumothorax.  Hila appear symmetrical.  IMPRESSION: No evidence of active pulmonary disease.  Original Report Authenticated By: Marlon Pel, M.D.    Medications: I have reviewed the patient's current medications.  Assessment/Plan: Patient Active Problem List  Diagnoses  . Angina at rest  . HTN (hypertension)  . DM (diabetes mellitus) type 2  . Tobacco abuse  . Hypokalemia   PLAN:  Continues with NTG paste.  Plan for cath in am. EKG SR without acute changes.  LOS: 1 day   INGOLD,LAURA R 12/02/2011, 8:37 AM       Patient seen and examined. Agree with assessment and plan.  No further CP since admission on meds.  LFT's mildly elevated; will hold simvastatin: also pt admits to increased etoh over the holidays.  ECG today shows PRWP v1-4 with T inversion V6 and small Q wave in III. Plan definitive cardiac cath in am. K replete.  Discussed procedure in detail with pt and wife.   Maisie Fus  A. Tresa Endo, MD, [facility] 12/02/2011 9:28 AM

## 2011-12-03 ENCOUNTER — Encounter (HOSPITAL_COMMUNITY)
Admission: EM | Disposition: A | Discharge status: Home / Self Care | Payer: Self-pay | Source: Home / Self Care | Attending: Cardiology

## 2011-12-03 ENCOUNTER — Encounter (HOSPITAL_COMMUNITY): Payer: Self-pay | Admitting: Cardiology

## 2011-12-03 HISTORY — PX: LEFT HEART CATHETERIZATION WITH CORONARY ANGIOGRAM: SHX5451

## 2011-12-03 LAB — CARDIAC PANEL(CRET KIN+CKTOT+MB+TROPI)
CK, MB: 1.9 ng/mL (ref 0.3–4.0)
Relative Index: 1.2 (ref 0.0–2.5)
Total CK: 189 U/L (ref 7–232)
Total CK: 157 U/L (ref 7–232)
Troponin I: <0.3 ng/mL (ref <0.30)

## 2011-12-03 LAB — CBC
HCT: 36.8 % — ABNORMAL LOW (ref 39.0–52.0)
Hemoglobin: 13 g/dL (ref 13.0–17.0)
RBC: 4.16 MIL/uL — ABNORMAL LOW (ref 4.22–5.81)
WBC: 8.1 10*3/uL (ref 4.0–10.5)

## 2011-12-03 LAB — BASIC METABOLIC PANEL
BUN: 10 mg/dL (ref 6–23)
Calcium: 9.3 mg/dL (ref 8.4–10.5)
GFR calc non Af Amer: >90 mL/min (ref >90)
Glucose, Bld: 143 mg/dL — ABNORMAL HIGH (ref 70–99)

## 2011-12-03 LAB — POCT ACTIVATED CLOTTING TIME: Activated Clotting Time: 94 s

## 2011-12-03 SURGERY — LEFT HEART CATHETERIZATION WITH CORONARY ANGIOGRAM
Anesthesia: LOCAL

## 2011-12-03 MED ORDER — LIDOCAINE HCL (PF) 1 % IJ SOLN
INTRAMUSCULAR | Status: AC
  Filled 2011-12-03: qty 30

## 2011-12-03 MED ORDER — SODIUM CHLORIDE 0.9 % IV SOLN
1.0000 mL/kg/h | INTRAVENOUS | Status: DC
  Administered 2011-12-03: 1 mL/kg/h via INTRAVENOUS

## 2011-12-03 MED ORDER — DIAZEPAM 5 MG PO TABS: 5.0000 mg | ORAL_TABLET | Freq: Four times a day (QID) | ORAL | Status: DC | PRN

## 2011-12-03 MED ORDER — ONDANSETRON HCL 4 MG/2ML IJ SOLN: 4.0000 mg | Freq: Four times a day (QID) | INTRAMUSCULAR | Status: DC | PRN

## 2011-12-03 MED ORDER — MIDAZOLAM HCL 2 MG/2ML IJ SOLN
INTRAMUSCULAR | Status: AC
  Filled 2011-12-03: qty 2

## 2011-12-03 MED ORDER — HEPARIN (PORCINE) IN NACL 2-0.9 UNIT/ML-% IJ SOLN
INTRAMUSCULAR | Status: AC
  Filled 2011-12-03: qty 2000

## 2011-12-03 MED ORDER — NITROGLYCERIN 0.2 MG/ML ON CALL CATH LAB
INTRAVENOUS | Status: AC
  Filled 2011-12-03: qty 1

## 2011-12-03 MED ORDER — METOPROLOL TARTRATE 50 MG PO TABS: 50.0000 mg | ORAL_TABLET | Freq: Two times a day (BID) | ORAL | Status: DC

## 2011-12-03 MED ORDER — FENTANYL CITRATE 0.05 MG/ML IJ SOLN
INTRAMUSCULAR | Status: AC
Start: 2011-12-03 — End: 2011-12-03
  Filled 2011-12-03: qty 2

## 2011-12-03 MED ORDER — PANTOPRAZOLE SODIUM 40 MG PO TBEC: 40.0000 mg | DELAYED_RELEASE_TABLET | Freq: Every day | ORAL | Status: DC

## 2011-12-03 NOTE — Progress Notes (Signed)
Pt says he's done with smoking. He was smoking 2 black and mild cigars. Pt in action stage. Referred to 1-800 quit now for f/u and support. Discussed oral fixation substitutes, second hand smoke and in home smoking policy. Reviewed and gave pt Written education/contact information.

## 2011-12-03 NOTE — Op Note (Signed)
Kyle Weiss is a 40 y.o. male    [id number]  [id number] LOCATION:  FACILITY: [facility]  PHYSICIAN: Lennette Bihari, M.D., [facility] 03-12-1971   DATE OF PROCEDURE:  12/03/2011   [facility]  CARDIAC CATHETERIZATION    40 yo AA male with multiple significant cardiac risk factors who was admitted with recurrent episodes of chest pain.  ECG has shown  PRWP with T wave inversion in V5 and small inferior Q waves. Definitive cardiac catherization was recommended.   PROCEDURE DESCRIPTION:   The patient was brought to the second floor  [place] [facility]iac cath lab in the postabsorptive state. He was premedicated with 2 mg versed and 50 micrograms of fentanyl His Right groin was prepped and shaved in usual sterile fashion. Xylocaine 1% was used  for local anesthesia. A 5 French sheath was inserted into the Right Femoral Artery  using standard Seldinger technique. HEMODYNAMICS:    AO SYSTOLIC/AO DIASTOLIC:  125/93   LV SYSTOLIC/LV DIASTOLIC: 125/8/18  ANGIOGRAPHIC RESULTS:   1. Left main; normal 2. LAD; normal 3. Left circumflex; normal  4. Right coronary artery; normal, large dominant vessel 5.  Left ventriculography; RAO left ventriculogram was performed using  25 mL of Visipaque dye at 12 mL/second. The overall LVEF estimated  55 - 60 %;  No wall motion abnormalities.     IMPRESSION: Normal LV function   Normal coronary arteries.  Lennette Bihari, MD, [facility] 12/03/2011 11:46 AM

## 2011-12-03 NOTE — Progress Notes (Signed)
ANTICOAGULATION CONSULT NOTE - Follow Up Consult  Pharmacy Consult for Heparin Indication: Chest Pain/ACS  No Known Allergies  Patient Measurements: Height: 5\' 11"  (180.3 cm) Weight: 215 lb 13.3 oz (97.9 kg) IBW/kg (Calculated) : 75.3  Adjusted Body Weight:    Vital Signs: Temp: 97.6 F (36.4 C) (12/30 2354) Temp src: Oral (12/30 2354) BP: 131/84 mmHg (12/30 2354) Pulse Rate: 74  (12/30 2354)  Labs:  Alvira Philips 12/03/11 0324 12/03/11 0323 12/02/11 2353 12/02/11 1801 12/02/11 1454 12/02/11 0957 12/02/11 0825 12/02/11 0815 12/01/11 0718 12/01/11 0424 12/01/11 0406  HGB 13.0 -- -- 13.2 -- -- -- -- -- -- --  HCT 36.8* -- -- 37.0* -- -- -- -- -- 41.0 --  PLT 275 -- -- 273 -- -- -- -- -- -- 258  APTT -- -- -- -- -- -- -- -- 30 -- --  LABPROT -- -- -- -- -- -- -- -- 12.0 -- --  INR -- -- -- -- -- -- -- -- 0.87 -- --  HEPARINUNFRC -- 0.24* -- <0.10* -- <0.10* -- -- -- -- --  CREATININE 0.72 -- -- -- -- -- -- 0.62 0.63 -- --  CKTOTAL -- -- 189 -- 220 -- 240* -- -- -- --  CKMB -- -- 2.1 -- 2.5 -- 2.7 -- -- -- --  TROPONINI -- -- <0.30 -- <0.30 -- <0.30 -- -- -- --   Estimated Creatinine Clearance: 143.4 ml/min (by C-G formula based on Cr of 0.72).   Medications:  Prescriptions prior to admission  Medication Sig Dispense Refill  . lisinopril-hydrochlorothiazide (PRINZIDE,ZESTORETIC) 20-25 MG per tablet Take 1 tablet by mouth daily.        . metFORMIN (GLUCOPHAGE) 500 MG tablet Take 500 mg by mouth.          Assessment: Heparin level still slightly below desired goal range of 0.3-0.7 at 0.24.   Goal of Therapy:  Heparin level 0.3-0.7 units/ml   Plan:  Will increase heparin to 2900 units/hr. Recheck heparin level in 6 hrs.   Weyman Pedro D., MS Clinical Pharmacist [phone] 12/03/2011,4:58 AM

## 2011-12-03 NOTE — Progress Notes (Signed)
[facility] Progress Note  Subjective:  No further cp  Objective:   Vital Signs in the last 24 hours: Temp:  [97.6 F (36.4 C)-98.4 F (36.9 C)] 98.4 F (36.9 C) (12/31 0518) Pulse Rate:  [71-82] 71  (12/31 0518) Resp:  [19-21] 19  (12/31 0518) BP: (128-150)/(66-98) 142/93 mmHg (12/31 0518) SpO2:  [96 %-97 %] 97 % (12/31 0518)  Intake/Output from previous day:    Scheduled:   . aspirin  324 mg Oral NOW   Or  . aspirin  300 mg Rectal NOW  . aspirin  324 mg Oral Pre-Cath  . aspirin EC  81 mg Oral Daily  . diazepam  5 mg Oral On Call  . heparin  3,000 Units Intravenous Once  . heparin  4,000 Units Intravenous Once  . lisinopril  20 mg Oral Daily   And  . hydrochlorothiazide  25 mg Oral Daily  . insulin aspart  0-15 Units Subcutaneous TID WC  . metoprolol tartrate  50 mg Oral BID  . nitroGLYCERIN  1 inch Topical Q8H  . potassium chloride  40 mEq Oral BID  . simvastatin  20 mg Oral q1800  . sodium chloride  3 mL Intravenous Q12H    Physical Exam:   General appearance: alert, cooperative and no distress Neck: no adenopathy, no carotid bruit, no JVD, supple, symmetrical, trachea midline and thyroid not enlarged, symmetric, no tenderness/mass/nodules Lungs: clear to auscultation bilaterally Heart: S1, S2 normal Abdomen: soft, non-tender; bowel sounds normal; no masses,  no organomegaly Extremities: extremities normal, atraumatic, no cyanosis or edema Pulses: 2+ and symmetric   Rate: 65  Rhythm: sinus  Lab Results:   [facility] 12/03/11 0324 12/02/11 0815  NA 134* 133*  K 3.6 3.4*  CL 99 96  CO2 25 22  GLUCOSE 143* 183*  BUN 10 7  CREATININE 0.72 0.62    Basename 12/02/11 2353 12/02/11 1454  TROPONINI <0.30 <0.30  CK elevated   Basename 12/01/11 0718  PROT 8.2  ALBUMIN 3.9  AST 80*  ALT 61*  ALKPHOS 80  BILITOT 0.3  BILIDIR --  IBILI --   Cholesterol    Assessment/Plan:   Principal Problem:  *Angina at  rest Active Problems:  HTN (hypertension)  DM (diabetes mellitus) type 2  Tobacco abuse  Hypokalemia    For diagnostic cath today with possible PVI; discussed with pt and wife.  Lennette Bihari, MD, [facility] 12/03/2011, 8:23 AM

## 2011-12-03 NOTE — Progress Notes (Signed)
Pt discharged to home with family after all post-cath orders complete.  bandaid applied to level zero right groin site.  Prescriptions given, questions answered, verbalized understanding DC instructions and f/u appointments in place.

## 2011-12-05 ENCOUNTER — Encounter (HOSPITAL_COMMUNITY): Payer: Self-pay | Admitting: Cardiology

## 2011-12-05 NOTE — Discharge Summary (Signed)
Physician Discharge Summary  Patient ID: Kyle Weiss MRN: [id number] DOB/AGE: July 03, 1971 41 y.o.  Admit date: 12/01/2011 Discharge date: 12/05/2011  Time of dictation Pt. Discharged 01/02/11  Discharge Diagnoses:  Principal Problem:  *Angina at rest- non cardiac, by cardiac cath, 11/30/11, pain resolved Active Problems:  HTN (hypertension)  DM (diabetes mellitus) type 2  Hypokalemia  Tobacco abuse   Discharged Condition: good  Hospital Course: a 41 year old male patient presented to the emergency room in the early morning hours of 12/01/2011 secondary to chest pain. The discomfort started on 11/29/2011, described as left anterior chest pain more sharp, significant pain. He improved on Thursday but by Friday he initially felt a fullness in his head neck and then at 1:30 the morning he developed chest pain without radiation. He denied nausea or vomiting he had mild shortness of breath and diaphoresis. The pain was significant enough to cause him to sit up in bed, then his wife brought him to the emergency room. The pain resolved after he had oxygen and aspirin. On initial exam he was pain-free he also stated his heart has been racing earlier that morning the ER his heart rate was 112.    Nitroglycerin paste was added he was also given IV Lopressor and then started on by mouth Lopressor. He has a history of diabetes and hypertension he missed several days of this medication. His metformin was held in the hospital.  Dr. Tresa Endo saw and assessed him and felt cardiac catheterization would be the most prudent study. Plans were for cardiac catheterization.  Cardiac enzymes were negative patient stabilized and underwent cardiac catheterization 12/03/2011. He was found to have normal coronary arteries and an EF of 55-60%..  He was stable and post procedure he had no complications. He completed is [place] was able to ambulate in the hall without complications was stable and ready for discharge  home.  We did add by mouth Lopressor to his outpatient medications annual followup with Dr. Tresa Endo.    Consults: none  Significant Diagnostic Studies:  Sodium 134 potassium 3.6 chloride 99 CO2 25 BUN 10 creatinine 0.72 calcium 9.3 glucose 143  Cardiac enzymes troponin I less than 0.30x3  CK 157-189, MB 2.1 -- 1.9  Hemoglobin 13 hematocrit 36.8 WBC 8.1 and platelets 275  Total cholesterol 294 triglycerides 777 HDL 47 HDL 47 unable to determine LDL  Hemoglobin A1c 6.3, TSH 1.973.  Portable chest x-ray:No evidence of active pulmonary disease   Discharge Exam: Blood pressure 154/98, pulse 70, temperature 98.7 F (37.1 C), temperature source Oral, resp. rate 18, height 5\' 11"  (1.803 m), weight 97.9 kg (215 lb 13.3 oz), SpO2 99.00%. General appearance: alert, cooperative and no distress  Neck: no adenopathy, no carotid bruit, no JVD, supple, symmetrical, trachea midline and thyroid not enlarged, symmetric, no tenderness/mass/nodules  Lungs: clear to auscultation bilaterally  Heart: S1, S2 normal  Abdomen: soft, non-tender; bowel sounds normal; no masses, no organomegaly  Extremities: extremities normal, atraumatic, no cyanosis or edema  Pulses: 2+ and symmetric     Disposition: Home or Self Care   Discharge Medication List as of 12/03/2011  4:18 PM    START taking these medications   Details  metoprolol (LOPRESSOR) 50 MG tablet Take 1 tablet (50 mg total) by mouth 2 (two) times daily., Starting 12/03/2011, Until Tue 12/02/12, Print    pantoprazole (PROTONIX) 40 MG tablet Take 1 tablet (40 mg total) by mouth daily., Starting 12/03/2011, Until Tue 12/02/12, Print      CONTINUE these medications  which have NOT CHANGED   Details  lisinopril-hydrochlorothiazide (PRINZIDE,ZESTORETIC) 20-25 MG per tablet Take 1 tablet by mouth daily.  , Until Discontinued, Historical Med    metFORMIN (GLUCOPHAGE) 500 MG tablet Take 500 mg by mouth.  , Until Discontinued, Historical Med        Follow-up Information    Follow up with Lennette Bihari, MD on 12/25/2011. ( @ 3:45)    Contact information:   [address] [place] [address] [phone]          Signed: Leone Brand 12/05/2011, 1:38 PM

## 2013-01-25 ENCOUNTER — Emergency Department (HOSPITAL_COMMUNITY): Payer: BC Managed Care – PPO

## 2013-01-25 ENCOUNTER — Encounter (HOSPITAL_COMMUNITY): Payer: Self-pay | Admitting: *Deleted

## 2013-01-25 ENCOUNTER — Emergency Department (HOSPITAL_COMMUNITY)
Admission: EM | Admit: 2013-01-25 | Discharge: 2013-01-25 | Disposition: A | Discharge status: Home / Self Care | Payer: BC Managed Care – PPO | Attending: Emergency Medicine | Admitting: Emergency Medicine

## 2013-01-25 DIAGNOSIS — Z91199 Patient's noncompliance with other medical treatment and regimen due to unspecified reason: Secondary | ICD-10-CM | POA: Insufficient documentation

## 2013-01-25 DIAGNOSIS — R9431 Abnormal electrocardiogram [ECG] [EKG]: Secondary | ICD-10-CM | POA: Insufficient documentation

## 2013-01-25 DIAGNOSIS — F17200 Nicotine dependence, unspecified, uncomplicated: Secondary | ICD-10-CM | POA: Insufficient documentation

## 2013-01-25 DIAGNOSIS — R0789 Other chest pain: Secondary | ICD-10-CM | POA: Insufficient documentation

## 2013-01-25 DIAGNOSIS — R0602 Shortness of breath: Secondary | ICD-10-CM | POA: Insufficient documentation

## 2013-01-25 DIAGNOSIS — I10 Essential (primary) hypertension: Secondary | ICD-10-CM | POA: Insufficient documentation

## 2013-01-25 DIAGNOSIS — K30 Functional dyspepsia: Secondary | ICD-10-CM

## 2013-01-25 DIAGNOSIS — Z9119 Patient's noncompliance with other medical treatment and regimen: Secondary | ICD-10-CM | POA: Insufficient documentation

## 2013-01-25 DIAGNOSIS — K219 Gastro-esophageal reflux disease without esophagitis: Secondary | ICD-10-CM | POA: Insufficient documentation

## 2013-01-25 DIAGNOSIS — E785 Hyperlipidemia, unspecified: Secondary | ICD-10-CM | POA: Insufficient documentation

## 2013-01-25 DIAGNOSIS — R079 Chest pain, unspecified: Secondary | ICD-10-CM

## 2013-01-25 DIAGNOSIS — Z79899 Other long term (current) drug therapy: Secondary | ICD-10-CM | POA: Insufficient documentation

## 2013-01-25 DIAGNOSIS — E119 Type 2 diabetes mellitus without complications: Secondary | ICD-10-CM | POA: Insufficient documentation

## 2013-01-25 DIAGNOSIS — R110 Nausea: Secondary | ICD-10-CM | POA: Insufficient documentation

## 2013-01-25 DIAGNOSIS — Z8679 Personal history of other diseases of the circulatory system: Secondary | ICD-10-CM | POA: Insufficient documentation

## 2013-01-25 LAB — BASIC METABOLIC PANEL
CO2: 22 mEq/L (ref 19–32)
Chloride: 103 mEq/L (ref 96–112)
GFR calc non Af Amer: >90 mL/min (ref >90)
Glucose, Bld: 221 mg/dL — ABNORMAL HIGH (ref 70–99)
Potassium: 3.4 mEq/L — ABNORMAL LOW (ref 3.5–5.1)
Sodium: 137 mEq/L (ref 135–145)

## 2013-01-25 LAB — CBC
Hemoglobin: 14.2 g/dL (ref 13.0–17.0)
MCH: 31.6 pg (ref 26.0–34.0)
MCV: 88 fL (ref 78.0–100.0)
RBC: 4.49 MIL/uL (ref 4.22–5.81)

## 2013-01-25 LAB — TROPONIN I: Troponin I: <0.3 ng/mL (ref <0.30)

## 2013-01-25 MED ORDER — METFORMIN HCL 500 MG PO TABS: 500.0000 mg | ORAL_TABLET | Freq: Two times a day (BID) | ORAL | Status: DC

## 2013-01-25 MED ORDER — PANTOPRAZOLE SODIUM 40 MG IV SOLR
40.0000 mg | Freq: Once | INTRAVENOUS | Status: AC
  Administered 2013-01-25: 40 mg via INTRAVENOUS
  Filled 2013-01-25: qty 40

## 2013-01-25 MED ORDER — SODIUM CHLORIDE 0.9 % IV BOLUS (SEPSIS): 1000.0000 mL | Freq: Once | INTRAVENOUS | Status: DC

## 2013-01-25 MED ORDER — PANTOPRAZOLE SODIUM 20 MG PO TBEC: 40.0000 mg | DELAYED_RELEASE_TABLET | Freq: Every day | ORAL | Status: DC

## 2013-01-25 MED ORDER — GI COCKTAIL ~~LOC~~
30.0000 mL | Freq: Once | ORAL | Status: AC
  Administered 2013-01-25: 30 mL via ORAL
  Filled 2013-01-25: qty 30

## 2013-01-25 NOTE — ED Provider Notes (Signed)
History     CSN: [id number]  Arrival date & time 01/25/13  1478   First MD Initiated Contact with Patient 01/25/13 0403      Chief Complaint  Patient presents with  . Chest Pain    (Consider location/radiation/quality/duration/timing/severity/associated sxs/prior treatment) HPI Kyle Weiss is a 42 y.o. male with a history of 9 the cardiac angina with a negative cardiac catheterization on 12/01/2011 by Dr. Tresa Endo also GERD, hypertension, hyperlipidemia and diabetes presents with left-sided chest pain. Patient arrives via [facility].  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 Patient then stopped on his way home in 8 some "sausages" which he describes as a "bratwurst", he then went home and ate a slice of pizza. After he ate the pizza, he lay down and attempted to go to sleep but was tossing and turning and from somewhere between 12 to 1:00 noticed a burning sensation at the left side of his chest. Patient did have some associated nausea with the burning sensation, he did have shortness of breath with the burning sensation. His pain was initially 8/10 it is currently 4/10. Patient currently has no other associated symptoms. Patient is had similar pain to this in the past, patient says it was worse the last time he presented to the hospital in 2012.  Patient says he been treated for hypertension in the past but has not taken any of his medication, he's not taken any medicine for diabetes or GERD including metformin and Protonix respectively.  Patient smokes cigars.  He drinks about 10 beers weekly.  He took some aspirin via EMS.  Patient is a pertinent family history dad had heart attacks x2 CVA x3 he was 53 when he died.  Past Medical History  Diagnosis Date  . Hypertension   . Diabetes mellitus   . HTN (hypertension) 12/01/2011  . DM (diabetes mellitus) type 2 12/01/2011  . Tobacco abuse 12/01/2011  . Hypokalemia 12/01/2011  . Angina at  rest- non cardiac, by cardiac cath 12/01/2011  . Angina at rest- non cardiac, by cardiac cath, 11/30/11, pain resolved 12/01/2011    No past surgical history on file.  Family History  Problem Relation Age of Onset  . Hypertension Mother   . Diabetes type II Mother   . Stroke Mother   . Coronary artery disease Father   . Hypertension Father   . Diabetes type II Father   . Stroke Father   . Coronary artery disease Sister   . Hypertension Sister   . Diabetes type II Sister   . Cancer Brother   . Hypertension Sister   . Diabetes type II Sister   . Hypertension Brother   . Diabetes type II Brother   . Hypertension Brother   . Diabetes type II Brother     History  Substance Use Topics  . Smoking status: Current Every Day Smoker    Types: Cigars  . Smokeless tobacco: Never Used  . Alcohol Use: 11.2 oz/week    7 Cans of beer, 14 Drinks containing 0.5 oz of alcohol per week     Review of Systems At least 10pt or greater review of systems completed and are negative except where specified in the HPI.  Allergies  Review of patient's allergies indicates no known allergies.  Home Medications   Current Outpatient Rx  Name  Route  Sig  Dispense  Refill  . lisinopril-hydrochlorothiazide (PRINZIDE,ZESTORETIC) 20-25 MG per tablet  Oral   Take 1 tablet by mouth daily.           . metFORMIN (GLUCOPHAGE) 500 MG tablet   Oral   Take 500 mg by mouth.           . EXPIRED: metoprolol (LOPRESSOR) 50 MG tablet   Oral   Take 1 tablet (50 mg total) by mouth 2 (two) times daily.   60 tablet   11   . EXPIRED: pantoprazole (PROTONIX) 40 MG tablet   Oral   Take 1 tablet (40 mg total) by mouth daily.   30 tablet   2     BP 134/83  Temp(Src) 97.4 F (36.3 C) (Oral)  Resp 22  SpO2 96%  Physical Exam  Nursing notes reviewed.  Electronic medical record reviewed. VITAL SIGNS:   Filed Vitals:   01/25/13 0401 01/25/13 0415 01/25/13 0700  BP: 134/83 133/85 113/78  Pulse:   108 98  Temp: 97.4 F (36.3 C)    TempSrc: Oral    Resp: 22 21 22   SpO2: 96% 95% 96%   CONSTITUTIONAL: Awake, oriented, in no apparent distress, mildly intoxicated  HENT: Atraumatic, normocephalic, oral mucosa pink and moist, airway patent. Nares patent without drainage. External ears normal. EYES: Conjunctiva  mildly injected , EOMI, PERRLA NECK: Trachea midline, non-tender, supple CARDIOVASCULAR: Normal heart rate, Normal rhythm, No murmurs, rubs, gallops PULMONARY/CHEST: Clear to auscultation, no rhonchi, wheezes, or rales. Symmetrical breath sounds. Non-tender. ABDOMINAL: Non-distended, soft, non-tender - no rebound or guarding.  BS normal. NEUROLOGIC: Non-focal, moving all four extremities, no gross sensory or motor deficits. EXTREMITIES: No clubbing, cyanosis, or edema SKIN: Warm, Dry, No erythema, No rash  ED Course  Procedures (including critical care time)  Date: 01/25/2013  Rate: 109  Rhythm: sinus tachycardia  QRS Axis: normal  Intervals: normal  ST/T Wave abnormalities: normal  Conduction Disutrbances: none  Narrative Interpretation: Possible anterior infarct, age indeterminate with Q waves seen most prominently in the precordial leads, V 1 through  3 - this has not been an acute changes are seen on prior EKG dated 12/02/2011. There is no change from prior EKG  Labs Reviewed  BASIC METABOLIC PANEL - Abnormal; Notable for the following:    Potassium 3.4 (*)    Glucose, Bld 221 (*)    All other components within normal limits  TROPONIN I  CBC  TROPONIN I   Dg Chest 2 View  01/25/2013  *RADIOLOGY REPORT*  Clinical Data: Chest pain and shortness of breath.  CHEST - 2 VIEW  Comparison: 12/01/2011  Findings: The heart size and pulmonary vascularity are normal. The lungs appear clear and expanded without focal air space disease or consolidation. No blunting of the costophrenic angles.  No pneumothorax.  Mediastinal contours appear intact.  No significant change since the  previous study.  IMPRESSION: No evidence of active pulmonary disease.   Original Report Authenticated By: Burman Nieves, M.D.      1. Chest pain   2. GERD (gastroesophageal reflux disease)   3. Acid indigestion       MDM  Kyle Weiss is a 42 y.o. male presents to the emergency department after drinking tonight some beers and then goes back and tells me that it was a strong mixed drink as well, had some bratwurst on the way home as well as a piece of pizza before laying down and then had some burning chest pain. He does have a history of GERD and is not treated at this time.  Patient's history is certainly more consistent with acid reflux disease and acute coronary syndrome. Patient does have a pertinent history of having some high blood pressure, diabetes as well as hyperlipidemia. His blood pressure the emergency department is not that elevated with systolics about 130.  Labs are obtained showing a glucose of 221, labs are otherwise unremarkable including a negative troponin. Patient's EKG is unchanged from prior EKG, does show some sinus tachycardia with a rate of 109. Think the patient likely drinks a little more than he is letting on, he does appear mildly intoxicated as well - his sinus tachycardia is likely secondary to some mild dehydration after some alcohol drinking to   obtained a second troponin, which was also negative, patient has a negative delta troponin, he refused a fluid bolus. Patient says his pain was gone after he took the GI cocktail. Patient would like to go home.    Patient never followed up with cardiology after his catheterization, we'll have the patient followup with Dr. Allyson Sabal in 2 days. I will start him on Protonix, and will also start him back on his metformin as his glucose was 221 this evening and he has a history of diabetes.   I will not start him back on hypertensive medication, his blood pressure has been steadily in the 130s.   I explained the diagnosis  and have given explicit precautions to return to the ER including sustained chest pain, shortness of breath or any other new or worsening symptoms. The patient understands and accepts the medical plan as it's been dictated and I have answered patient's and his wife's questions. Discharge instructions concerning home care and prescriptions have been given. I have cautioned him that metformin may upset his stomach and cause gas - I instructed him to keep on taking the metformin and to follow up with his primary care physician also (also for GERD).  The patient is STABLE and is discharged to home in good condition.         Jones Skene, MD 01/25/13 [phone]

## 2013-01-25 NOTE — ED Notes (Signed)
New and old EKG given to Dr. Bonk. Copy placed in pt chart. 

## 2013-01-25 NOTE — ED Notes (Signed)
324 ASA administered by EMS prior to arrival

## 2013-01-25 NOTE — ED Notes (Signed)
Pt arrived from home via [facility] c/o burning non radiating CP x 2 hours. Pt states he has some ETOH, and is noncompliant with his heart medications

## 2013-07-17 ENCOUNTER — Ambulatory Visit: Payer: Self-pay | Admitting: Family Medicine

## 2013-07-17 VITALS — BP 160/110 | HR 81 | Temp 98.1°F | Resp 18 | Wt 222.0 lb

## 2013-07-17 DIAGNOSIS — E785 Hyperlipidemia, unspecified: Secondary | ICD-10-CM

## 2013-07-17 DIAGNOSIS — E119 Type 2 diabetes mellitus without complications: Secondary | ICD-10-CM

## 2013-07-17 DIAGNOSIS — I10 Essential (primary) hypertension: Secondary | ICD-10-CM

## 2013-07-17 LAB — POCT GLYCOSYLATED HEMOGLOBIN (HGB A1C): Hemoglobin A1C: 5.4

## 2013-07-17 MED ORDER — METFORMIN HCL 500 MG PO TABS: 500.0000 mg | ORAL_TABLET | Freq: Every day | ORAL | Status: DC

## 2013-07-17 MED ORDER — FENOFIBRATE 145 MG PO TABS: 145.0000 mg | ORAL_TABLET | Freq: Every day | ORAL | Status: DC

## 2013-07-17 MED ORDER — LISINOPRIL 20 MG PO TABS: 20.0000 mg | ORAL_TABLET | Freq: Every day | ORAL | Status: DC

## 2013-07-17 MED ORDER — ATORVASTATIN CALCIUM 20 MG PO TABS: 20.0000 mg | ORAL_TABLET | Freq: Every day | ORAL | Status: DC

## 2013-07-17 NOTE — Progress Notes (Signed)
 [facility]:  Office Visit  Chief Complaint:  Chief Complaint  Patient presents with  . rx refills    metformin, lisinopril and cholesterol med    HPI: Kyle Weiss is a 42 y.o. male who complains of  Here for mediation refills.  He lost hist job 7-8 months ago and has not been on any of his meds for DM, HTN, XOL.  He needs everything controlled before he can start his new job He went to get a pre-employment physical and was told that his BP and XOL were too high so he needs to get rechecked.  HTN-noncompliant DM-noncompliant, occ has some tingling on left index finger, not UTD on eye exam.  Hyperlipidemia-noncompliant, does not remember name of meds, made him sweat.   He recnetly had labs done on 07/15/2013 with pre0employment exam Total chol 322 TG 639 LDL undetected  CMP-Na, K, liver enzymes normal, Creatinine 0.76 CBC normal TSH normal Past Medical History  Diagnosis Date  . Hypertension   . Diabetes mellitus   . HTN (hypertension) 12/01/2011  . DM (diabetes mellitus) type 2 12/01/2011  . Tobacco abuse 12/01/2011  . Hypokalemia 12/01/2011  . Angina at rest- non cardiac, by cardiac cath 12/01/2011  . Angina at rest- non cardiac, by cardiac cath, 11/30/11, pain resolved 12/01/2011   No past surgical history on file. History   Social History  . Marital Status: Married    Spouse Name: N/A    Number of Children: N/A  . Years of Education: N/A   Social History Main Topics  . Smoking status: Current Every Day Smoker    Types: Cigars  . Smokeless tobacco: Never Used  . Alcohol Use: 11.2 oz/week    7 Cans of beer, 14 Drinks containing 0.5 oz of alcohol per week  . Drug Use: No  . Sexual Activity: Yes   Other Topics Concern  . None   Social History Narrative  . None   Family History  Problem Relation Age of Onset  . Hypertension Mother   . Diabetes type II Mother   . Stroke Mother   . Diabetes Mother   . Coronary artery disease  Father   . Hypertension Father   . Diabetes type II Father   . Stroke Father   . Coronary artery disease Sister   . Hypertension Sister   . Diabetes type II Sister   . Cancer Brother   . Hypertension Sister   . Diabetes type II Sister   . Hypertension Brother   . Diabetes type II Brother   . Hypertension Brother   . Diabetes type II Brother    No Known Allergies Prior to Admission medications   Medication Sig Start Date End Date Taking? Authorizing Provider  lisinopril (PRINIVIL,ZESTRIL) 20 MG tablet Take 20 mg by mouth daily.   Yes Historical Provider, MD  metFORMIN (GLUCOPHAGE) 500 MG tablet Take 1 tablet (500 mg total) by mouth 2 (two) times daily with a meal. 01/25/13   John-Adam Bonk, MD  pantoprazole (PROTONIX) 20 MG tablet Take 2 tablets (40 mg total) by mouth daily. 01/25/13   John-Adam Bonk, MD     ROS: The patient denies fevers, chills, night sweats, unintentional weight loss, chest pain, palpitations, wheezing, dyspnea on exertion, nausea, vomiting, abdominal pain, dysuria, hematuria, melena, numbness, weakness, or tingling.   All other systems have been reviewed and were otherwise negative with the exception of those mentioned in the HPI and as above.    PHYSICAL  EXAM: Filed Vitals:   07/17/13 0830  BP: 160/110  Pulse: 81  Temp: 98.1 F (36.7 C)  Resp: 18   Filed Vitals:   07/17/13 0830  Weight: 222 lb (100.699 kg)   Body mass index is 30.98 kg/(m^2).  General: Alert, no acute distress HEENT:  Normocephalic, atraumatic, oropharynx patent. EOMI, PERRLA, fundoscopic nl Cardiovascular:  Regular rate and rhythm, no rubs murmurs or gallops.  No Carotid bruits, radial pulse intact. No pedal edema.  Respiratory: Clear to auscultation bilaterally.  No wheezes, rales, or rhonchi.  No cyanosis, no use of accessory musculature GI: No organomegaly, abdomen is soft and non-tender, positive bowel sounds.  No masses. Skin: No rashes. Neurologic: Facial musculature  symmetric. Microfilament exam nl.  Psychiatric: Patient is appropriate throughout our interaction. Lymphatic: No cervical lymphadenopathy Musculoskeletal: Gait intact.   LABS: Results for orders placed in visit on 07/17/13  POCT GLYCOSYLATED HEMOGLOBIN (HGB A1C)      Result Value Range   Hemoglobin A1C 5.4       EKG/XRAY:   Primary read interpreted by Dr. Conley Rolls at [facility].   ASSESSMENT/PLAN: Encounter Diagnoses  Name Primary?  . DM (diabetes mellitus) Yes  . HTN (hypertension)   . Other and unspecified hyperlipidemia    HTN-Lisinopril 20 mg daily DM-will change metformin to 500 mg daily since HbA1c is doing so well at 5.4 XOl-will start statin and tricor, he had problems with simavastatin in the past so will switch to Lipitor.  Recommend : ADA diet, BP goal <140/90, daily foot exams, tobacco cessation if smoking, annual eye exam, annual flu vaccine, PNA vaccine if age and time appropriate.  He will get eye exam once added to wife's insurance F/u in 2 month for check on XOL He knows that goal for BP < 140/90 , return to office sooner if not under control  He is to see the pre-employment clinic next week so will check his BP Gross sideeffects, risk and benefits, and alternatives of medications d/w patient. Patient is aware that all medications have potential sideeffects and we are unable to predict every sideeffect or drug-drug interaction that may occur. Micoralbumin pending Lab results from prior blood draw on 07/15/2013 scanned  ,  PHUONG, DO 07/17/2013 9:57 AM

## 2013-07-17 NOTE — Patient Instructions (Signed)
Diabetes and Exercise Regular exercise is important and can help:   Control blood glucose (sugar).  Decrease blood pressure.    Control blood lipids (cholesterol, triglycerides).  Improve overall health. BENEFITS FROM EXERCISE  Improved fitness.  Improved flexibility.  Improved endurance.  Increased bone density.  Weight control.  Increased muscle strength.  Decreased body fat.  Improvement of the body's use of insulin, a hormone.  Increased insulin sensitivity.  Reduction of insulin needs.  Reduced stress and tension.  Helps you feel better. People with diabetes who add exercise to their lifestyle gain additional benefits, including:  Weight loss.  Reduced appetite.  Improvement of the body's use of blood glucose.  Decreased risk factors for heart disease:  Lowering of cholesterol and triglycerides.  Raising the level of good cholesterol (high-density lipoproteins, HDL).  Lowering blood sugar.  Decreased blood pressure. TYPE 1 DIABETES AND EXERCISE  Exercise will usually lower your blood glucose.  If blood glucose is greater than 240 mg/dl, check urine ketones. If ketones are present, do not exercise.  Location of the insulin injection sites may need to be adjusted with exercise. Avoid injecting insulin into areas of the body that will be exercised. For example, avoid injecting insulin into:  The arms when playing tennis.  The legs when jogging. For more information, discuss this with your caregiver.  Keep a record of:  Food intake.  Type and amount of exercise.  Expected peak times of insulin action.  Blood glucose levels. Do this before, during, and after exercise. Review your records with your caregiver. This will help you to develop guidelines for adjusting food intake and insulin amounts.  TYPE 2 DIABETES AND EXERCISE  Regular physical activity can help control blood glucose.  Exercise is important because it may:  Increase the  body's sensitivity to insulin.  Improve blood glucose control.  Exercise reduces the risk of heart disease. It decreases serum cholesterol and triglycerides. It also lowers blood pressure.  Those who take insulin or oral hypoglycemic agents should watch for signs of hypoglycemia. These signs include dizziness, shaking, sweating, chills, and confusion.  Body water is lost during exercise. It must be replaced. This will help to avoid loss of body fluids (dehydration) or heat stroke. Be sure to talk to your caregiver before starting an exercise program to make sure it is safe for you. Remember, any activity is better than none.  Document Released: 02/09/2004 Document Revised: 02/11/2012 Document Reviewed: 05/26/2009 [facility] Patient Information 2014 [place], [place]. Hypertension As your heart beats, it forces blood through your arteries. This force is your blood pressure. If the pressure is too high, it is called hypertension (HTN) or high blood pressure. HTN is dangerous because you may have it and not know it. High blood pressure may mean that your heart has to work harder to pump blood. Your arteries may be narrow or stiff. The extra work puts you at risk for heart disease, stroke, and other problems.  Blood pressure consists of two numbers, a higher number over a lower, 110/72, for example. It is stated as "110 over 72." The ideal is below 120 for the top number (systolic) and under 80 for the bottom (diastolic). Write down your blood pressure today. You should pay close attention to your blood pressure if you have certain conditions such as:  Heart failure.  Prior heart attack.  Diabetes  Chronic kidney disease.  Prior stroke.  Multiple risk factors for heart disease. To see if you have HTN, your blood  pressure should be measured while you are seated with your arm held at the level of the heart. It should be measured at least twice. A one-time elevated blood pressure reading (especially  in the Emergency Department) does not mean that you need treatment. There may be conditions in which the blood pressure is different between your right and left arms. It is important to see your caregiver soon for a recheck. Most people have essential hypertension which means that there is not a specific cause. This type of high blood pressure may be lowered by changing lifestyle factors such as:  Stress.  Smoking.  Lack of exercise.  Excessive weight.  Drug/tobacco/alcohol use.  Eating less salt. Most people do not have symptoms from high blood pressure until it has caused damage to the body. Effective treatment can often prevent, delay or reduce that damage. TREATMENT  When a cause has been identified, treatment for high blood pressure is directed at the cause. There are a large number of medications to treat HTN. These fall into several categories, and your caregiver will help you select the medicines that are best for you. Medications may have side effects. You should review side effects with your caregiver. If your blood pressure stays high after you have made lifestyle changes or started on medicines,   Your medication(s) may need to be changed.  Other problems may need to be addressed.  Be certain you understand your prescriptions, and know how and when to take your medicine.  Be sure to follow up with your caregiver within the time frame advised (usually within two weeks) to have your blood pressure rechecked and to review your medications.  If you are taking more than one medicine to lower your blood pressure, make sure you know how and at what times they should be taken. Taking two medicines at the same time can result in blood pressure that is too low. SEEK IMMEDIATE MEDICAL CARE IF:  You develop a severe headache, blurred or changing vision, or confusion.  You have unusual weakness or numbness, or a faint feeling.  You have severe chest or abdominal pain, vomiting, or  breathing problems. MAKE SURE YOU:   Understand these instructions.  Will watch your condition.  Will get help right away if you are not doing well or get worse. Document Released: 11/19/2005 Document Revised: 02/11/2012 Document Reviewed: 07/09/2008 [facility] Patient Information 2014 [place], [place]. DASH Diet The DASH diet stands for "Dietary Approaches to Stop Hypertension." It is a healthy eating plan that has been shown to reduce high blood pressure (hypertension) in as little as 14 days, while also possibly providing other significant health benefits. These other health benefits include reducing the risk of breast cancer after menopause and reducing the risk of type 2 diabetes, heart disease, colon cancer, and stroke. Health benefits also include weight loss and slowing kidney failure in patients with chronic kidney disease.  DIET GUIDELINES  Limit salt (sodium). Your diet should contain less than 1500 mg of sodium daily.  Limit refined or processed carbohydrates. Your diet should include mostly whole grains. Desserts and added sugars should be used sparingly.  Include small amounts of heart-healthy fats. These types of fats include nuts, oils, and tub margarine. Limit saturated and trans fats. These fats have been shown to be harmful in the body. CHOOSING FOODS  The following food groups are based on a 2000 calorie diet. See your Registered Dietitian for individual calorie needs. Grains and Grain Products (6 to 8 servings daily)  Eat More Often: Whole-wheat bread, brown rice, whole-grain or wheat pasta, quinoa, popcorn without added fat or salt (air popped).  Eat Less Often: White bread, white pasta, white rice, cornbread. Vegetables (4 to 5 servings daily)  Eat More Often: Fresh, frozen, and canned vegetables. Vegetables may be raw, steamed, roasted, or grilled with a minimal amount of fat.  Eat Less Often/Avoid: Creamed or fried vegetables. Vegetables in a cheese sauce. Fruit  (4 to 5 servings daily)  Eat More Often: All fresh, canned (in natural juice), or frozen fruits. Dried fruits without added sugar. One hundred percent fruit juice ( cup [237 mL] daily).  Eat Less Often: Dried fruits with added sugar. Canned fruit in light or heavy syrup. [employer], Fish, and Poultry (2 servings or less daily. One serving is 3 to 4 oz [85-114 g]).  Eat More Often: Ninety percent or leaner ground beef, tenderloin, sirloin. Round cuts of beef, chicken breast, [place] breast. All fish. Grill, bake, or broil your meat. Nothing should be fried.  Eat Less Often/Avoid: Fatty cuts of meat, [place], or chicken leg, thigh, or wing. Fried cuts of meat or fish. Dairy (2 to 3 servings)  Eat More Often: Low-fat or fat-free milk, low-fat plain or light yogurt, reduced-fat or part-skim cheese.  Eat Less Often/Avoid: Milk (whole, 2%).Whole milk yogurt. Full-fat cheeses. Nuts, Seeds, and Legumes (4 to 5 servings per week)  Eat More Often: All without added salt.  Eat Less Often/Avoid: Salted nuts and seeds, canned beans with added salt. Fats and Sweets (limited)  Eat More Often: Vegetable oils, tub margarines without trans fats, sugar-free gelatin. Mayonnaise and salad dressings.  Eat Less Often/Avoid: Coconut oils, palm oils, butter, stick margarine, cream, half and half, cookies, candy, pie. FOR MORE INFORMATION The Dash Diet Eating Plan: www.dashdiet.org Document Released: 11/08/2011 Document Revised: 02/11/2012 Document Reviewed: 11/08/2011 [facility] Patient Information 2014 [place], [place]. Hypertriglyceridemia  Diet for High blood levels of Triglycerides Most fats in food are triglycerides. Triglycerides in your blood are stored as fat in your body. High levels of triglycerides in your blood may put you at a greater risk for heart disease and stroke.  Normal triglyceride levels are less than 150 mg/dL. Borderline high levels are 150-199 mg/dl. High levels are 200 - 499 mg/dL, and  very high triglyceride levels are greater than 500 mg/dL. The decision to treat high triglycerides is generally based on the level. For people with borderline or high triglyceride levels, treatment includes weight loss and exercise. Drugs are recommended for people with very high triglyceride levels. Many people who need treatment for high triglyceride levels have metabolic syndrome. This syndrome is a collection of disorders that often include: insulin resistance, high blood pressure, blood clotting problems, high cholesterol and triglycerides. TESTING PROCEDURE FOR TRIGLYCERIDES  You should not eat 4 hours before getting your triglycerides measured. The normal range of triglycerides is between 10 and 250 milligrams per deciliter (mg/dl). Some people may have extreme levels (1000 or above), but your triglyceride level may be too high if it is above 150 mg/dl, depending on what other risk factors you have for heart disease.  People with high blood triglycerides may also have high blood cholesterol levels. If you have high blood cholesterol as well as high blood triglycerides, your risk for heart disease is probably greater than if you only had high triglycerides. High blood cholesterol is one of the main risk factors for heart disease. CHANGING YOUR DIET  Your weight can affect your blood triglyceride level.  If you are more than 20% above your ideal body weight, you may be able to lower your blood triglycerides by losing weight. Eating less and exercising regularly is the best way to combat this. Fat provides more calories than any other food. The best way to lose weight is to eat less fat. Only 30% of your total calories should come from fat. Less than 7% of your diet should come from saturated fat. A diet low in fat and saturated fat is the same as a diet to decrease blood cholesterol. By eating a diet lower in fat, you may lose weight, lower your blood cholesterol, and lower your blood triglyceride level.   Eating a diet low in fat, especially saturated fat, may also help you lower your blood triglyceride level. Ask your dietitian to help you figure how much fat you can eat based on the number of calories your caregiver has prescribed for you.  Exercise, in addition to helping with weight loss may also help lower triglyceride levels.   Alcohol can increase blood triglycerides. You may need to stop drinking alcoholic beverages.  Too much carbohydrate in your diet may also increase your blood triglycerides. Some complex carbohydrates are necessary in your diet. These may include bread, rice, potatoes, other starchy vegetables and cereals.  Reduce "simple" carbohydrates. These may include pure sugars, candy, honey, and jelly without losing other nutrients. If you have the kind of high blood triglycerides that is affected by the amount of carbohydrates in your diet, you will need to eat less sugar and less high-sugar foods. Your caregiver can help you with this.  Adding 2-4 grams of fish oil (EPA+ DHA) may also help lower triglycerides. Speak with your caregiver before adding any supplements to your regimen. Following the Diet  Maintain your ideal weight. Your caregivers can help you with a diet. Generally, eating less food and getting more exercise will help you lose weight. Joining a weight control group may also help. Ask your caregivers for a good weight control group in your area.  Eat low-fat foods instead of high-fat foods. This can help you lose weight too.  These foods are lower in fat. Eat MORE of these:   Dried beans, peas, and lentils.  Egg whites.  Low-fat cottage cheese.  Fish.  Lean cuts of meat, such as round, sirloin, rump, and flank (cut extra fat off meat you fix).  Whole grain breads, cereals and pasta.  Skim and nonfat dry milk.  Low-fat yogurt.  Poultry without the skin.  Cheese made with skim or part-skim milk, such as mozzarella, parmesan, farmers', ricotta, or pot  cheese. These are higher fat foods. Eat LESS of these:   Whole milk and foods made from whole milk, such as American, blue, cheddar, monterey jack, and swiss cheese  High-fat meats, such as luncheon meats, sausages, knockwurst, bratwurst, hot dogs, ribs, corned beef, ground pork, and regular ground beef.  Fried foods. Limit saturated fats in your diet. Substituting unsaturated fat for saturated fat may decrease your blood triglyceride level. You will need to read package labels to know which products contain saturated fats.  These foods are high in saturated fat. Eat LESS of these:   Fried pork skins.  Whole milk.  Skin and fat from poultry.  Palm oil.  Butter.  Shortening.  Cream cheese.  Tomasa Blase.  Margarines and baked goods made from listed oils.  Vegetable shortenings.  Chitterlings.  Fat from meats.  Coconut oil.  Palm kernel oil.  Lard.  Cream.  Sour cream.  Fatback.  Coffee whiteners and non-dairy creamers made with these oils.  Cheese made from whole milk. Use unsaturated fats (both polyunsaturated and monounsaturated) moderately. Remember, even though unsaturated fats are better than saturated fats; you still want a diet low in total fat.  These foods are high in unsaturated fat:   Canola oil.  Sunflower oil.  Mayonnaise.  Almonds.  Peanuts.  Pine nuts.  Margarines made with these oils.  Safflower oil.  Olive oil.  Avocados.  Cashews.  Peanut butter.  Sunflower seeds.  Soybean oil.  Peanut oil.  Olives.  Pecans.  Walnuts.  Pumpkin seeds. Avoid sugar and other high-sugar foods. This will decrease carbohydrates without decreasing other nutrients. Sugar in your food goes rapidly to your blood. When there is excess sugar in your blood, your liver may use it to make more triglycerides. Sugar also contains calories without other important nutrients.  Eat LESS of these:   Sugar, brown sugar, powdered sugar, jam, jelly,  preserves, honey, syrup, molasses, pies, candy, cakes, cookies, frosting, pastries, colas, soft drinks, punches, fruit drinks, and regular gelatin.  Avoid alcohol. Alcohol, even more than sugar, may increase blood triglycerides. In addition, alcohol is high in calories and low in nutrients. Ask for sparkling water, or a diet soft drink instead of an alcoholic beverage. Suggestions for planning and preparing meals   Bake, broil, grill or roast meats instead of frying.  Remove fat from meats and skin from poultry before cooking.  Add spices, herbs, lemon juice or vinegar to vegetables instead of salt, rich sauces or gravies.  Use a non-stick skillet without fat or use no-stick sprays.  Cool and refrigerate stews and broth. Then remove the hardened fat floating on the surface before serving.  Refrigerate meat drippings and skim off fat to make low-fat gravies.  Serve more fish.  Use less butter, margarine and other high-fat spreads on bread or vegetables.  Use skim or reconstituted non-fat dry milk for cooking.  Cook with low-fat cheeses.  Substitute low-fat yogurt or cottage cheese for all or part of the sour cream in recipes for sauces, dips or congealed salads.  Use half yogurt/half mayonnaise in salad recipes.  Substitute evaporated skim milk for cream. Evaporated skim milk or reconstituted non-fat dry milk can be whipped and substituted for whipped cream in certain recipes.  Choose fresh fruits for dessert instead of high-fat foods such as pies or cakes. Fruits are naturally low in fat. When Dining Out   Order low-fat appetizers such as fruit or vegetable juice, pasta with vegetables or tomato sauce.  Select clear, rather than cream soups.  Ask that dressings and gravies be served on the side. Then use less of them.  Order foods that are baked, broiled, poached, steamed, stir-fried, or roasted.  Ask for margarine instead of butter, and use only a small amount.  Drink  sparkling water, unsweetened tea or coffee, or diet soft drinks instead of alcohol or other sweet beverages. QUESTIONS AND ANSWERS ABOUT OTHER FATS IN THE BLOOD: SATURATED FAT, TRANS FAT, AND CHOLESTEROL What is trans fat? Trans fat is a type of fat that is formed when vegetable oil is hardened through a process called hydrogenation. This process helps makes foods more solid, gives them shape, and prolongs their shelf life. Trans fats are also called hydrogenated or partially hydrogenated oils.  What do saturated fat, trans fat, and cholesterol in foods have to do with heart disease? Saturated fat, trans fat, and cholesterol  in the diet all raise the level of LDL "bad" cholesterol in the blood. The higher the LDL cholesterol, the greater the risk for coronary heart disease (CHD). Saturated fat and trans fat raise LDL similarly.  What foods contain saturated fat, trans fat, and cholesterol? High amounts of saturated fat are found in animal products, such as fatty cuts of meat, chicken skin, and full-fat dairy products like butter, whole milk, cream, and cheese, and in tropical vegetable oils such as palm, palm kernel, and coconut oil. Trans fat is found in some of the same foods as saturated fat, such as vegetable shortening, some margarines (especially hard or stick margarine), crackers, cookies, baked goods, fried foods, salad dressings, and other processed foods made with partially hydrogenated vegetable oils. Small amounts of trans fat also occur naturally in some animal products, such as milk products, beef, and lamb. Foods high in cholesterol include liver, other organ meats, egg yolks, shrimp, and full-fat dairy products. How can I use the new food label to make heart-healthy food choices? Check the Nutrition Facts panel of the food label. Choose foods lower in saturated fat, trans fat, and cholesterol. For saturated fat and cholesterol, you can also use the Percent Daily Value (%DV): 5% DV or less is  low, and 20% DV or more is high. (There is no %DV for trans fat.) Use the Nutrition Facts panel to choose foods low in saturated fat and cholesterol, and if the trans fat is not listed, read the ingredients and limit products that list shortening or hydrogenated or partially hydrogenated vegetable oil, which tend to be high in trans fat. POINTS TO REMEMBER:   Discuss your risk for heart disease with your caregivers, and take steps to reduce risk factors.  Change your diet. Choose foods that are low in saturated fat, trans fat, and cholesterol.  Add exercise to your daily routine if it is not already being done. Participate in physical activity of moderate intensity, like brisk walking, for at least 30 minutes on most, and preferably all days of the week. No time? Break the 30 minutes into three, 10-minute segments during the day.  Stop smoking. If you do smoke, contact your caregiver to discuss ways in which they can help you quit.  Do not use street drugs.  Maintain a normal weight.  Maintain a healthy blood pressure.  Keep up with your blood work for checking the fats in your blood as directed by your caregiver. Document Released: 09/06/2004 Document Revised: 05/20/2012 Document Reviewed: 04/04/2009 [facility] Patient Information 2014 [place], [place].

## 2013-07-18 LAB — MICROALBUMIN, URINE: Microalb, Ur: 7.69 mg/dL — ABNORMAL HIGH (ref 0.00–1.89)

## 2013-08-14 ENCOUNTER — Telehealth: Payer: Self-pay

## 2013-08-14 NOTE — Telephone Encounter (Signed)
Patient is calling us back about his lab results from a couple of days ago

## 2013-08-14 NOTE — Telephone Encounter (Signed)
Spoke with pt. Pt is doing good on all of his meds.

## 2014-05-23 ENCOUNTER — Ambulatory Visit (INDEPENDENT_AMBULATORY_CARE_PROVIDER_SITE_OTHER): Payer: 59 | Admitting: Family Medicine

## 2014-05-23 VITALS — BP 160/94 | HR 99 | Temp 97.7°F | Resp 16 | Ht 73.25 in | Wt 215.0 lb

## 2014-05-23 DIAGNOSIS — H65192 Other acute nonsuppurative otitis media, left ear: Secondary | ICD-10-CM

## 2014-05-23 DIAGNOSIS — E119 Type 2 diabetes mellitus without complications: Secondary | ICD-10-CM

## 2014-05-23 DIAGNOSIS — I10 Essential (primary) hypertension: Secondary | ICD-10-CM

## 2014-05-23 DIAGNOSIS — K029 Dental caries, unspecified: Secondary | ICD-10-CM

## 2014-05-23 DIAGNOSIS — E785 Hyperlipidemia, unspecified: Secondary | ICD-10-CM

## 2014-05-23 DIAGNOSIS — H65199 Other acute nonsuppurative otitis media, unspecified ear: Secondary | ICD-10-CM

## 2014-05-23 MED ORDER — LISINOPRIL 40 MG PO TABS: 40.0000 mg | ORAL_TABLET | Freq: Every day | ORAL | Status: DC

## 2014-05-23 MED ORDER — AMOXICILLIN 500 MG PO CAPS: 500.0000 mg | ORAL_CAPSULE | Freq: Three times a day (TID) | ORAL | Status: DC

## 2014-05-23 NOTE — Progress Notes (Signed)
Chief Complaint:  Chief Complaint  Patient presents with  . pain around his left ear    pressure/pain around his left ear--painful to lay down x 2 days.  this happend about 1 month ago but cleared up.  also would like his right great toe checked.     HPI: Kyle Weiss is a 43 y.o. male who is here for : 1. Last 2 days he had had pain and also neck pain on his left side, when he lays on his left side he feels like there is fluid. IF he switches to his roght side, the pain eases away and the ony way he can sleep is if he lays on his back.  He has not has any fver sor chills or nights , weigh loss.   2. Has not followe dup for DM, HTN, Hyperlipidemia Stopped taking tricor and also lipitor 20 mg due to SEs Did not return for fasting labs Deneis neuropathy  Past Medical History  Diagnosis Date  . Hypertension   . Diabetes mellitus   . HTN (hypertension) 12/01/2011  . DM (diabetes mellitus) type 2 12/01/2011  . Tobacco abuse 12/01/2011  . Hypokalemia 12/01/2011  . Angina at rest- non cardiac, by cardiac cath 12/01/2011  . Angina at rest- non cardiac, by cardiac cath, 11/30/11, pain resolved 12/01/2011   No past surgical history on file. History   Social History  . Marital Status: Married    Spouse Name: N/A    Number of Children: N/A  . Years of Education: N/A   Social History Main Topics  . Smoking status: Current Every Day Smoker    Types: Cigars  . Smokeless tobacco: Never Used  . Alcohol Use: 11.2 oz/week    7 Cans of beer, 14 Drinks containing 0.5 oz of alcohol per week  . Drug Use: No  . Sexual Activity: Yes   Other Topics Concern  . None   Social History Narrative  . None   Family History  Problem Relation Age of Onset  . Hypertension Mother   . Diabetes type II Mother   . Stroke Mother   . Diabetes Mother   . Coronary artery disease Father   . Hypertension Father   . Diabetes type II Father   . Stroke Father   . Coronary artery disease  Sister   . Hypertension Sister   . Diabetes type II Sister   . Cancer Brother   . Hypertension Sister   . Diabetes type II Sister   . Hypertension Brother   . Diabetes type II Brother   . Hypertension Brother   . Diabetes type II Brother    No Known Allergies Prior to Admission medications   Medication Sig Start Date End Date Taking? Authorizing Provider  lisinopril (PRINIVIL,ZESTRIL) 20 MG tablet Take 1 tablet (20 mg total) by mouth daily. 07/17/13  Yes Thao P Le, DO  metFORMIN (GLUCOPHAGE) 500 MG tablet Take 1 tablet (500 mg total) by mouth daily with breakfast. 07/17/13  Yes Thao P Le, DO     ROS: The patient denies fevers, chills, night sweats, unintentional weight loss, chest pain, palpitations, wheezing, dyspnea on exertion, nausea, vomiting, abdominal pain, dysuria, hematuria, melena, numbness, weakness, or tingling.   All other systems have been reviewed and were otherwise negative with the exception of those mentioned in the HPI and as above.    PHYSICAL EXAM: Filed Vitals:   05/23/14 1148  BP: 160/94  Pulse: 99  Temp:  97.7 F (36.5 C)  Resp: 16   Filed Vitals:   05/23/14 1148  Height: 6' 1.25" (1.861 m)  Weight: 215 lb (97.523 kg)   Body mass index is 28.16 kg/(m^2).  General: Alert, no acute distress HEENT:  Normocephalic, atraumatic, oropharynx patent. EOMI, PERRLA fundo exam nl. HAs had eye exam in last 1 year. NO exudates, tender left ear, TM is irritated and red Cardiovascular:  Regular rate and rhythm, no rubs murmurs or gallops.  No Carotid bruits, radial pulse intact. No pedal edema.  Respiratory: Clear to auscultation bilaterally.  No wheezes, rales, or rhonchi.  No cyanosis, no use of accessory musculature GI: No organomegaly, abdomen is soft and non-tender, positive bowel sounds.  No masses. Skin: No rashes. Neurologic: Facial musculature symmetric. Microfilament exam nl Psychiatric: Patient is appropriate throughout our interaction. Lymphatic: No  cervical lymphadenopathy Musculoskeletal: Gait intact. + dental caries  LABS: Results for orders placed in visit on 07/17/13  MICROALBUMIN, URINE      Result Value Ref Range   Microalb, Ur 7.69 (*) 0.00 - 1.89 mg/dL  POCT GLYCOSYLATED HEMOGLOBIN (HGB A1C)      Result Value Ref Range   Hemoglobin A1C 5.4       EKG/XRAY:   Primary read interpreted [facility].   ASSESSMENT/PLAN: Encounter Diagnoses  Name Primary?  . Other acute nonsuppurative otitis media of left ear Yes  . Essential hypertension   . Other and unspecified hyperlipidemia   . Type II or unspecified type diabetes mellitus without mention of complication, not stated as uncontrolled   . Dental caries    Will increase lisinopril to 40 mg daily Rx amoxacillin TID Will have to adjust metformin based on A1C F/u in 1 day for fasting labs Denies neuropathy, CP, SOB, hypoglycemia, palpitations, presyncope/syncope, polydipsia, polyuria, nausea, vomiting, abd pain Recommend : ADA diet, BP goal <140/90, daily foot exams, tobacco cessation if smoking, annual eye exam, annual flu vaccine, PNA vaccine if age and time appropriate.    Gross sideeffects, risk and benefits, and alternatives of medications d/w patient. Patient is aware that all medications have potential sideeffects and we are unable to predict every sideeffect or drug-drug interaction that may occur.  Hamilton CapriLE, THAO PHUONG, DO 05/23/2014 12:51 PM

## 2014-05-23 NOTE — Patient Instructions (Signed)
Diabetes and Standards of Medical Care  Diabetes is complicated. You may find that your diabetes team includes a dietitian, nurse, diabetes educator, eye doctor, and more. To help everyone know what is going on and to help you get the care you deserve, the following schedule of care was developed to help keep you on track. Below are the tests, exams, vaccines, medicines, education, and plans you will need. HbA1c test This test shows how well you have controlled your glucose over the past 2-3 months. It is used to see if your diabetes management plan needs to be adjusted.   It is performed at least 2 times a year if you are meeting treatment goals.  It is performed 4 times a year if therapy has changed or if you are not meeting treatment goals. Blood pressure test  This test is performed at every routine medical visit. The goal is less than 140/90 mmHg for most people, but 130/80 mmHg in some cases. Ask your health care Akshar Starnes about your goal. Dental exam  Follow up with the dentist regularly. Eye exam  If you are diagnosed with type 1 diabetes as a child, get an exam upon reaching the age of 10 years or older and have had diabetes for 3-5 years. Yearly eye exams are recommended after that initial eye exam.  If you are diagnosed with type 1 diabetes as an adult, get an exam within 5 years of diagnosis and then yearly.  If you are diagnosed with type 2 diabetes, get an exam as soon as possible after the diagnosis and then yearly. Foot care exam  Visual foot exams are performed at every routine medical visit. The exams check for cuts, injuries, or other problems with the feet.  A comprehensive foot exam should be done yearly. This includes visual inspection as well as assessing foot pulses and testing for loss of sensation.  Check your feet nightly for cuts, injuries, or other problems with your feet. Tell your health care Regino Fournet if anything is not healing. Kidney function test (urine  microalbumin)  This test is performed once a year.  Type 1 diabetes: The first test is performed 5 years after diagnosis.  Type 2 diabetes: The first test is performed at the time of diagnosis.  A serum creatinine and estimated glomerular filtration rate (eGFR) test is done once a year to assess the level of chronic kidney disease (CKD), if present. Lipid profile (cholesterol, HDL, LDL, triglycerides)  Performed every 5 years for most people.  The goal for LDL is less than 100 mg/dL. If you are at high risk, the goal is less than 70 mg/dL.  The goal for HDL is 40 mg/dL-50 mg/dL for men and 50 mg/dL-60 mg/dL for women. An HDL cholesterol of 60 mg/dL or higher gives some protection against heart disease.  The goal for triglycerides is less than 150 mg/dL. Influenza vaccine, pneumococcal vaccine, and hepatitis B vaccine  The influenza vaccine is recommended yearly.  The pneumococcal vaccine is generally given once in a lifetime. However, there are some instances when another vaccination is recommended. Check with your health care Shuntel Fishburn.  The hepatitis B vaccine is also recommended for adults with diabetes. Diabetes self-management education  Education is recommended at diagnosis and ongoing as needed. Treatment plan  Your treatment plan is reviewed at every medical visit. Document Released: 09/16/2009 Document Revised: 07/22/2013 Document Reviewed: 04/21/2013 ExitCare Patient Information 2015 [employer]. This information is not intended to replace advice given to you by your   health care Bessye Stith. Make sure you discuss any questions you have with your health care Brance Dartt.

## 2014-05-24 ENCOUNTER — Other Ambulatory Visit (INDEPENDENT_AMBULATORY_CARE_PROVIDER_SITE_OTHER): Payer: 59 | Admitting: *Deleted

## 2014-05-24 DIAGNOSIS — E785 Hyperlipidemia, unspecified: Secondary | ICD-10-CM

## 2014-05-24 DIAGNOSIS — I10 Essential (primary) hypertension: Secondary | ICD-10-CM

## 2014-05-24 DIAGNOSIS — E119 Type 2 diabetes mellitus without complications: Secondary | ICD-10-CM

## 2014-05-24 DIAGNOSIS — H65192 Other acute nonsuppurative otitis media, left ear: Secondary | ICD-10-CM

## 2014-05-24 DIAGNOSIS — K029 Dental caries, unspecified: Secondary | ICD-10-CM

## 2014-05-24 DIAGNOSIS — H65199 Other acute nonsuppurative otitis media, unspecified ear: Secondary | ICD-10-CM

## 2014-05-24 LAB — COMPLETE METABOLIC PANEL WITHOUT GFR
Albumin: 4.8 g/dL (ref 3.5–5.2)
Alkaline Phosphatase: 60 U/L (ref 39–117)
Chloride: 104 meq/L (ref 96–112)
GFR, Est Non African American: >89 mL/min
Glucose, Bld: 148 mg/dL — ABNORMAL HIGH (ref 70–99)
Potassium: 3.8 meq/L (ref 3.5–5.3)
Sodium: 138 meq/L (ref 135–145)
Total Protein: 8 g/dL (ref 6.0–8.3)

## 2014-05-24 LAB — POCT URINALYSIS DIPSTICK
Bilirubin, UA: NEGATIVE
Glucose, UA: NEGATIVE
Leukocytes, UA: NEGATIVE
Nitrite, UA: NEGATIVE
Protein, UA: 100
Spec Grav, UA: 1.025
Urobilinogen, UA: 1
pH, UA: 5.5

## 2014-05-24 LAB — POCT CBC
Granulocyte percent: 45.7 % (ref 37–80)
HCT, POC: 43.7 % (ref 43.5–53.7)
Hemoglobin: 14.3 g/dL (ref 14.1–18.1)
Lymph, poc: 2.6 (ref 0.6–3.4)
MCH, POC: 30.8 pg (ref 27–31.2)
MCHC: 32.7 g/dL (ref 31.8–35.4)
MCV: 94.2 fL (ref 80–97)
MID (cbc): 0.4 (ref 0–0.9)
MPV: 7.6 fL (ref 0–99.8)
POC Granulocyte: 2.5 (ref 2–6.9)
POC LYMPH PERCENT: 47.7 %L (ref 10–50)
POC MID %: 6.6 %M (ref 0–12)
Platelet Count, POC: 249 10*3/uL (ref 142–424)
RBC: 4.64 M/uL — AB (ref 4.69–6.13)
RDW, POC: 13.5 %
WBC: 5.4 10*3/uL (ref 4.6–10.2)

## 2014-05-24 LAB — POCT UA - MICROSCOPIC ONLY
Bacteria, U Microscopic: NEGATIVE
Casts, Ur, LPF, POC: NEGATIVE
Crystals, Ur, HPF, POC: NEGATIVE
Epithelial cells, urine per micros: NEGATIVE
Mucus, UA: NEGATIVE
RBC, urine, microscopic: NEGATIVE
WBC, Ur, HPF, POC: NEGATIVE
Yeast, UA: NEGATIVE

## 2014-05-24 LAB — COMPLETE METABOLIC PANEL WITH GFR
ALT: 45 U/L (ref 0–53)
AST: 47 U/L — ABNORMAL HIGH (ref 0–37)
BUN: 13 mg/dL (ref 6–23)
CO2: 24 mEq/L (ref 19–32)
Calcium: 8.9 mg/dL (ref 8.4–10.5)
Creat: 0.75 mg/dL (ref 0.50–1.35)
GFR, Est African American: >89 mL/min
Total Bilirubin: 0.6 mg/dL (ref 0.2–1.2)

## 2014-05-24 LAB — LIPID PANEL
Cholesterol: 306 mg/dL — ABNORMAL HIGH (ref 0–200)
HDL: 51 mg/dL (ref >39)
Total CHOL/HDL Ratio: 6 Ratio
Triglycerides: 670 mg/dL — ABNORMAL HIGH (ref <150)

## 2014-05-24 LAB — TSH: TSH: 1.15 u[IU]/mL (ref 0.350–4.500)

## 2014-05-24 LAB — POCT GLYCOSYLATED HEMOGLOBIN (HGB A1C): Hemoglobin A1C: 6

## 2014-05-24 NOTE — Progress Notes (Signed)
Pt here for labs only. 

## 2014-05-26 ENCOUNTER — Telehealth: Payer: Self-pay | Admitting: Family Medicine

## 2014-05-26 DIAGNOSIS — E785 Hyperlipidemia, unspecified: Secondary | ICD-10-CM

## 2014-05-26 MED ORDER — PRAVASTATIN SODIUM 20 MG PO TABS: 20.0000 mg | ORAL_TABLET | Freq: Every day | ORAL | Status: DC

## 2014-05-26 NOTE — Telephone Encounter (Signed)
Spoke with wife, needs to be on statin , will do low dose since he was on it before and has some sweats. Recheck in 2 months

## 2014-05-30 ENCOUNTER — Telehealth: Payer: Self-pay

## 2014-05-30 NOTE — Telephone Encounter (Signed)
The patient's wife called to report that the patient is experiencing fatigue and nausea on the Pravastatin--requesting call back from clinical staff regarding need for medication dosage change or medication change.  Requests return call at [phone].

## 2014-05-30 NOTE — Telephone Encounter (Signed)
Dr. Conley RollsLe, spoke with wife. She wanted to let you know that pt is experincing nausea and fatigue on the Pravastatin.

## 2014-05-30 NOTE — Telephone Encounter (Signed)
Spoke with wife, avise to stop pravastatin 20 mg, after SEs are resolved, then consider 1/2 tab daily, if he is still having sxs then need to stop it. He will start  fish oil. If he is able to tolerate 10 mg but still has some lingering SEs then consider pravastatin 10 mg 1/2 tab daily. He would be on a low dose of 5 mg. F/u prn otherwise in 2 months for LFTs

## 2014-07-13 ENCOUNTER — Emergency Department (HOSPITAL_COMMUNITY)
Admission: EM | Admit: 2014-07-13 | Discharge: 2014-07-13 | Disposition: A | Discharge status: Home / Self Care | Payer: 59 | Attending: Emergency Medicine | Admitting: Emergency Medicine

## 2014-07-13 ENCOUNTER — Encounter (HOSPITAL_COMMUNITY): Payer: Self-pay | Admitting: Emergency Medicine

## 2014-07-13 DIAGNOSIS — R51 Headache: Secondary | ICD-10-CM | POA: Diagnosis present

## 2014-07-13 DIAGNOSIS — R05 Cough: Secondary | ICD-10-CM | POA: Diagnosis not present

## 2014-07-13 DIAGNOSIS — F17200 Nicotine dependence, unspecified, uncomplicated: Secondary | ICD-10-CM | POA: Insufficient documentation

## 2014-07-13 DIAGNOSIS — G44009 Cluster headache syndrome, unspecified, not intractable: Secondary | ICD-10-CM | POA: Insufficient documentation

## 2014-07-13 DIAGNOSIS — Z79899 Other long term (current) drug therapy: Secondary | ICD-10-CM | POA: Insufficient documentation

## 2014-07-13 DIAGNOSIS — I209 Angina pectoris, unspecified: Secondary | ICD-10-CM | POA: Diagnosis not present

## 2014-07-13 DIAGNOSIS — R059 Cough, unspecified: Secondary | ICD-10-CM | POA: Insufficient documentation

## 2014-07-13 DIAGNOSIS — J3489 Other specified disorders of nose and nasal sinuses: Secondary | ICD-10-CM | POA: Diagnosis not present

## 2014-07-13 DIAGNOSIS — Z792 Long term (current) use of antibiotics: Secondary | ICD-10-CM | POA: Insufficient documentation

## 2014-07-13 DIAGNOSIS — I10 Essential (primary) hypertension: Secondary | ICD-10-CM | POA: Insufficient documentation

## 2014-07-13 DIAGNOSIS — Z9889 Other specified postprocedural states: Secondary | ICD-10-CM | POA: Insufficient documentation

## 2014-07-13 DIAGNOSIS — E119 Type 2 diabetes mellitus without complications: Secondary | ICD-10-CM | POA: Insufficient documentation

## 2014-07-13 MED ORDER — SUMATRIPTAN SUCCINATE 6 MG/0.5ML ~~LOC~~ SOLN
6.0000 mg | Freq: Once | SUBCUTANEOUS | Status: AC
  Administered 2014-07-13: 6 mg via SUBCUTANEOUS
  Filled 2014-07-13: qty 0.5

## 2014-07-13 MED ORDER — VERAPAMIL HCL ER 120 MG PO TBCR: 120.0000 mg | EXTENDED_RELEASE_TABLET | Freq: Two times a day (BID) | ORAL | Status: DC

## 2014-07-13 NOTE — ED Provider Notes (Signed)
CSN: [id number]     Arrival date & time 07/13/14  1103 History   First MD Initiated Contact with Patient 07/13/14 1108     Chief Complaint  Patient presents with  . Headache    HPI Mr. Kyle Weiss is a 43 yo man with HTN, HLD and DMII who is presenting with a 5 day history of headache and cold-symptoms. He states that the headache has been on and off, and up to 10/10 in intensity. It always starts on the right side of his head or in his right neck, then often spreads to the left side of his head. He has noticed tearing out of his right eye and redness in that eye. The headache is occasionally worsened by lying down, but he denies any changes in vision or whooshing sound. He has never had headaches in the past. He has also had congestion and has a cough productive of yellow sputum. He denies fevers and chills. He tried treating his headache with ibuprofen, but it did not help.   Past Medical History  Diagnosis Date  . Hypertension   . Diabetes mellitus   . HTN (hypertension) 12/01/2011  . DM (diabetes mellitus) type 2 12/01/2011  . Tobacco abuse 12/01/2011  . Hypokalemia 12/01/2011  . Angina at rest- non cardiac, by cardiac cath 12/01/2011  . Angina at rest- non cardiac, by cardiac cath, 11/30/11, pain resolved 12/01/2011   History reviewed. No pertinent past surgical history. Family History  Problem Relation Age of Onset  . Hypertension Mother   . Diabetes type II Mother   . Stroke Mother   . Diabetes Mother   . Coronary artery disease Father   . Hypertension Father   . Diabetes type II Father   . Stroke Father   . Coronary artery disease Sister   . Hypertension Sister   . Diabetes type II Sister   . Cancer Brother   . Hypertension Sister   . Diabetes type II Sister   . Hypertension Brother   . Diabetes type II Brother   . Hypertension Brother   . Diabetes type II Brother    History  Substance Use Topics  . Smoking status: Current Every Day Smoker    Types: Cigars  .  Smokeless tobacco: Never Used  . Alcohol Use: 11.2 oz/week    7 Cans of beer, 14 Drinks containing 0.5 oz of alcohol per week    Review of Systems General: decreased appetite Skin: no rashes HEENT: see HPI, right eye tearing, right eye conjunctival injection, neck and head pain, recent ear infection (one month ago, treated with 7 day course of amoxicillin) Cardiac: no chest pain or palpitations Respiratory: no shortness of breath or wheezing GI: no changes in BMs Urinary: no dysuria, hematuria, frequency Msk: no aches, no joint pain Psychiatric: no recent stressors, history of anxiety/depression   Allergies  Review of patient's allergies indicates no known allergies.  Home Medications   Prior to Admission medications   Medication Sig Start Date End Date Taking? Authorizing Provider  amoxicillin (AMOXIL) 500 MG capsule Take 1 capsule (500 mg total) by mouth 3 (three) times daily. 05/23/14   Thao P Le, DO  lisinopril (PRINIVIL,ZESTRIL) 40 MG tablet Take 1 tablet (40 mg total) by mouth daily. 05/23/14   Thao P Le, DO  metFORMIN (GLUCOPHAGE) 500 MG tablet Take 1 tablet (500 mg total) by mouth daily with breakfast. 07/17/13   Thao P Le, DO  pravastatin (PRAVACHOL) 20 MG tablet Take 1 tablet (20  mg total) by mouth daily. Return in 2 months for fasting blood work 05/26/14   Thao P Le, DO   BP 160/100  Pulse 90  Temp(Src) 98.6 F (37 C) (Oral)  Resp 16  Ht 5\' 11"  (1.803 m)  Wt 220 lb (99.791 kg)  BMI 30.70 kg/m2  SpO2 96% Physical Exam Appearance: sitting up in bed with wife at bedside HEENT: patient had right eye tearing during exam and obvious congestion, able to touch chin to chest and no pain on palpation of neck or temples Heart: RRR, normal S1S2 Lungs: CTAB, no WRR Abdomen: BS+, nontender Musculoskeletal: no tenderness to palpation Extremities: no edema Neurologic: facial sensation and hearing symmetric and intact Skin: no rashes or lesions   ED Course  Procedures  (including critical care time) Labs Review Labs Reviewed - No data to display  Imaging Review No results found.   EKG Interpretation None      MDM   Final diagnoses:  None    This 43 yo man appears to be having his first cluster headache. He will be given O2 therapy at 10L by non-rebreather, Maurice. He will also be given sumatriptan injection.     Dionne AnoJulia Gaelle Adriance, MD 07/21/14 [phone]

## 2014-07-13 NOTE — ED Notes (Signed)
Pt placed on oxygen for cluster headache

## 2014-07-13 NOTE — Discharge Instructions (Signed)
Please start taking the verapamil medication twice a day. It will help to prevent your  Cluster Headaches.  Cluster headaches are recognized by their pattern of deep, intense head pain. They normally occur on one side of your head, but they may "switch sides" in subsequent episodes. Typically, cluster headaches:   Are severe in nature.   Occur repeatedly over weeks to months and are followed by periods of no headaches.   Can last from 15 minutes to 3 hours.   Occur at the same time each day, often at night.   Occur several times a day. CAUSES The exact cause of cluster headaches is not known. Alcohol use may be associated with cluster headaches. SIGNS AND SYMPTOMS   Severe pain that begins in or around your eye or temple.   One-sided head pain.   Feeling sick to your stomach (nauseous).   Sensitivity to light.   Runny nose.   Eye redness, tearing, and nasal stuffiness on the side of your head where you are experiencing pain.   Sweaty, pale skin of the face.   Droopy or swollen eyelid.   Restlessness. DIAGNOSIS  Cluster headaches are diagnosed based on symptoms and a physical exam. Your health care provider may order a CT scan or an MRI of your head or lab tests to see if your headaches are caused by other medical conditions.  TREATMENT   Medicines for pain relief and to prevent recurrent attacks. Some people may need a combination of medicines.  Oxygen for pain relief.   Biofeedback programs to help reduce headache pain.  It may be helpful to keep a headache diary. This may help you find a trend for what is triggering your headaches. Your health care provider can develop a treatment plan.  HOME CARE INSTRUCTIONS  During cluster periods:   Follow a regular sleep schedule. Do not vary the amount and time that you sleep from day to day. It is important to stay on the same schedule during a cluster period to help prevent headaches.   Avoid alcohol.    Stop smoking if you smoke.  SEEK MEDICAL CARE IF:  You have any changes from your previous cluster headaches either in intensity or frequency.   You are not getting relief from medicines you are taking.  SEEK IMMEDIATE MEDICAL CARE IF:   You faint.   You have weakness or numbness, especially on one side of your body or face.   You have double vision.   You have nausea or vomiting that is not relieved within several hours.   You cannot keep your balance or have difficulty talking or walking.   You have neck pain or stiffness.   You have a fever. MAKE SURE YOU:  Understand these instructions.   Will watch your condition.   Will get help right away if you are not doing well or get worse. Document Released: 11/19/2005 Document Revised: 09/09/2013 Document Reviewed: 06/11/2013 [facility] Patient Information 2015 [employer] [place]. This information is not intended to replace advice given to you by your health care provider. Make sure you discuss any questions you have with your health care provider.

## 2014-07-13 NOTE — ED Notes (Signed)
He states hes had cough/cold symptoms for past few days. Since yesterday he has developed a headache. States the pain gets worse when lying flat. He was treated for an ear infection last month and did not finish all his antibiotics.

## 2014-07-23 NOTE — ED Provider Notes (Signed)
I saw and evaluated the patient, reviewed the resident's note and I agree with the findings and plan.   EKG Interpretation None      Patient here with R sided headache, eye tearing, conunctival injection. Having multiple episodes over past 5 days - this is c/w cluster headache. Oxygen applied and provided much relief. I spoke with Neuro who recommended initiated verapamil. Stable for discharge.  Elwin MochaBlair Joni Norrod, MD 07/23/14 [phone]

## 2014-09-17 ENCOUNTER — Ambulatory Visit: Payer: 59 | Admitting: Family Medicine

## 2014-09-30 ENCOUNTER — Other Ambulatory Visit: Payer: Self-pay | Admitting: Family Medicine

## 2014-09-30 ENCOUNTER — Telehealth: Payer: Self-pay

## 2014-09-30 DIAGNOSIS — E119 Type 2 diabetes mellitus without complications: Secondary | ICD-10-CM

## 2014-09-30 MED ORDER — METFORMIN HCL 500 MG PO TABS: 500.0000 mg | ORAL_TABLET | Freq: Every day | ORAL | Status: DC

## 2014-09-30 NOTE — Telephone Encounter (Signed)
Sent in 15 day supply- pt will be coming in on Thursday.

## 2014-09-30 NOTE — Telephone Encounter (Signed)
Patient's wife called. States patient is out of metformin and needs a refill.

## 2014-10-19 ENCOUNTER — Ambulatory Visit: Payer: 59 | Admitting: Family Medicine

## 2014-10-20 ENCOUNTER — Encounter: Payer: Self-pay | Admitting: Family Medicine

## 2014-10-20 ENCOUNTER — Other Ambulatory Visit: Payer: Self-pay | Admitting: *Deleted

## 2014-10-20 ENCOUNTER — Ambulatory Visit (INDEPENDENT_AMBULATORY_CARE_PROVIDER_SITE_OTHER): Payer: 59 | Admitting: Family Medicine

## 2014-10-20 ENCOUNTER — Telehealth: Payer: Self-pay | Admitting: Cardiovascular Disease

## 2014-10-20 VITALS — BP 162/106 | HR 93 | Temp 98.2°F | Resp 17 | Ht 72.5 in | Wt 217.0 lb

## 2014-10-20 DIAGNOSIS — R9431 Abnormal electrocardiogram [ECG] [EKG]: Secondary | ICD-10-CM

## 2014-10-20 DIAGNOSIS — I10 Essential (primary) hypertension: Secondary | ICD-10-CM

## 2014-10-20 DIAGNOSIS — E785 Hyperlipidemia, unspecified: Secondary | ICD-10-CM

## 2014-10-20 DIAGNOSIS — E119 Type 2 diabetes mellitus without complications: Secondary | ICD-10-CM

## 2014-10-20 LAB — POCT URINALYSIS DIPSTICK
BILIRUBIN UA: NEGATIVE
GLUCOSE UA: NEGATIVE
KETONES UA: NEGATIVE
Leukocytes, UA: NEGATIVE
Nitrite, UA: NEGATIVE
Protein, UA: 100
Urobilinogen, UA: 0.2
pH, UA: 5.5

## 2014-10-20 LAB — POCT GLYCOSYLATED HEMOGLOBIN (HGB A1C): Hemoglobin A1C: 6.3

## 2014-10-20 LAB — MICROALBUMIN, URINE: Microalb, Ur: 13.5 mg/dL — ABNORMAL HIGH (ref <2.0)

## 2014-10-20 MED ORDER — AMLODIPINE BESYLATE 5 MG PO TABS: 5.0000 mg | ORAL_TABLET | Freq: Every day | ORAL | Status: DC

## 2014-10-20 MED ORDER — METFORMIN HCL 500 MG PO TABS: 500.0000 mg | ORAL_TABLET | Freq: Every day | ORAL | Status: DC

## 2014-10-20 MED ORDER — LISINOPRIL-HYDROCHLOROTHIAZIDE 20-25 MG PO TABS: 1.0000 | ORAL_TABLET | Freq: Every day | ORAL | Status: DC

## 2014-10-20 MED ORDER — ROSUVASTATIN CALCIUM 20 MG PO TABS: 20.0000 mg | ORAL_TABLET | Freq: Every day | ORAL | Status: DC

## 2014-10-20 NOTE — Patient Instructions (Addendum)
We are changing you from lisinopril to lisinopril HCT 20/25 take one each morning.  Begin amlodipine 5 mg 1 daily for blood pressure also  Continue metformin 500 mg one daily  Begin Crestor 20 mg one daily for cholesterol  Works light-duty  Return in one week for follow-up regarding the blood pressure.  See Dr. Mardene Speakhompson Kelley for the echocardiogram and cardiology evaluation when his office contacts you.  If you develop any symptoms such as chest pains or excessive shortness of breath or lightheadedness or other items of concern go to the emergency room for further evaluation immediately. Call 911 if necessary.

## 2014-10-20 NOTE — Progress Notes (Signed)
Subjective: 43 year old man who has a history of hypertension and diabetes and hyperlipidemia.He ran out of his medicines 2 days ago. Yesterday at a clinic for his job his blood pressure was quite high. They sent him home told him he needed to see his doctor and get a game plan for correcting things. He deals well. No headaches or dizziness. No chest pains. No palpitations. No shortness of breath. No exertional difficulties. He says that not long ago his blood pressure was high also.  Objective: Pleasant alert gentleman in no acute distress. Throat clear. Neck supple without nodes or thyromegaly. Chest clear. Heart regular without murmurs gallops or arrhythmias. Abdomen soft without mass or tenderness. Review the labs from the job place. These were fairly satisfactory except for a couple of things.. The glucose was 138 cholesterol 288, triglycerides 3:30. TSH was normal.  Assessment: Hypertension, uncontrolled Hyperlipidemia, fairly uncontrolled  Plan: EKG, urine dip, urine microalbumin  EKG reveals an ischemic pattern with T-wave inversions in the lateral leads.Once again questioned the patient and he is completely asymptomatic.  The patient informs me that he did have heart studies done by Dr. Daphene Jaegerom Kelly about 3 years ago.  Results for orders placed or performed in visit on 10/20/14  POCT glycosylated hemoglobin (Hb A1C)  Result Value Ref Range   Hemoglobin A1C 6.3   POCT urinalysis dipstick  Result Value Ref Range   Color, UA dark yellow    Clarity, UA clear    Glucose, UA neg    Bilirubin, UA neg    Ketones, UA neg    Spec Grav, UA >=1.030    Blood, UA trace-lysed    pH, UA 5.5    Protein, UA 100    Urobilinogen, UA 0.2    Nitrite, UA neg    Leukocytes, UA Negative    diabetes is satisfactory control He has some protein in his urine and we will await test for urinary microalbumin.  Spoke with his cardiologist, Dr. Daphene Jaegerom Kelly, who reviewed things on the Epic and will set up an  echocardiogram and follow-up with him.  Wrote a letter to his workplace instructing that he should be on light duty until the cardiologist clears him. I believe he can do a light duty job since he is asymptomatic and had normal coronaries 3 years ago, but we do need to work hard on getting his blood pressure controlled. Dr. Landry DykeKelly's office should be contacting him in the next day or so to set up an echocardiogram and when he should see Dr. Tresa EndoKelly  Spent 1 hour of time on this patient in direct care.  Patient said that regional care physicians wanted a note regarding what the game plan was. I called and did not get an answer. My assistant will try and call to see if we can send them a copy of reports.

## 2014-10-20 NOTE — Telephone Encounter (Signed)
Dr. Tresa EndoKelly spoke with patient's PCP and decided that he needs a echo and office follow up with Dr. Tresa EndoKelly. Eco was ordered by me. [facility].

## 2014-10-21 ENCOUNTER — Telehealth: Payer: Self-pay | Admitting: Cardiovascular Disease

## 2014-10-21 ENCOUNTER — Telehealth (HOSPITAL_COMMUNITY): Payer: Self-pay | Admitting: *Deleted

## 2014-10-21 NOTE — Telephone Encounter (Signed)
Mr. Kyle Weiss is out because he needs to be cleared before he can go back to work and wanting to know what is he supposed to do , because he need to get back to work . Please Call   Thanks

## 2014-10-21 NOTE — Telephone Encounter (Signed)
I spoke with the pt and he is out of work at this time without pay. The pt took his letter to work in regards to light duty and they told him that they did not have any jobs that met this criteria and he could not come back to work until he has no restrictions.  I have scheduled the pt to have an Echo on 10/22/14 and see Dr Claiborne Billings on 10/25/14. Pt aware of appointments.

## 2014-10-21 NOTE — Telephone Encounter (Signed)
Pt said Dr Alwyn RenHopper talked to Dr Myrtie SomanKelly Yesterday. He is waiting to hear from you on what he is suppose to do.

## 2014-10-22 ENCOUNTER — Ambulatory Visit (HOSPITAL_COMMUNITY): Payer: 59 | Attending: Cardiovascular Disease | Admitting: Cardiology

## 2014-10-22 ENCOUNTER — Other Ambulatory Visit (HOSPITAL_COMMUNITY): Payer: Self-pay | Admitting: Family Medicine

## 2014-10-22 DIAGNOSIS — E119 Type 2 diabetes mellitus without complications: Secondary | ICD-10-CM | POA: Diagnosis not present

## 2014-10-22 DIAGNOSIS — R9431 Abnormal electrocardiogram [ECG] [EKG]: Secondary | ICD-10-CM

## 2014-10-22 DIAGNOSIS — I10 Essential (primary) hypertension: Secondary | ICD-10-CM | POA: Insufficient documentation

## 2014-10-22 DIAGNOSIS — E785 Hyperlipidemia, unspecified: Secondary | ICD-10-CM | POA: Insufficient documentation

## 2014-10-22 NOTE — Progress Notes (Signed)
Echo performed. 

## 2014-10-25 ENCOUNTER — Encounter: Payer: Self-pay | Admitting: Cardiovascular Disease

## 2014-10-25 ENCOUNTER — Ambulatory Visit (INDEPENDENT_AMBULATORY_CARE_PROVIDER_SITE_OTHER): Payer: 59 | Admitting: Cardiovascular Disease

## 2014-10-25 VITALS — BP 120/90 | HR 96 | Ht 71.0 in | Wt 222.6 lb

## 2014-10-25 DIAGNOSIS — Z720 Tobacco use: Secondary | ICD-10-CM

## 2014-10-25 DIAGNOSIS — E782 Mixed hyperlipidemia: Secondary | ICD-10-CM

## 2014-10-25 DIAGNOSIS — I10 Essential (primary) hypertension: Secondary | ICD-10-CM

## 2014-10-25 DIAGNOSIS — E119 Type 2 diabetes mellitus without complications: Secondary | ICD-10-CM

## 2014-10-25 DIAGNOSIS — Z79899 Other long term (current) drug therapy: Secondary | ICD-10-CM

## 2014-10-25 MED ORDER — FENOFIBRATE 145 MG PO TABS: 145.0000 mg | ORAL_TABLET | Freq: Every day | ORAL | Status: DC

## 2014-10-25 MED ORDER — ATORVASTATIN CALCIUM 40 MG PO TABS: 40.0000 mg | ORAL_TABLET | Freq: Every day | ORAL | Status: DC

## 2014-10-25 NOTE — Patient Instructions (Signed)
Your physician has recommended you make the following change in your medication: STOP the pravastatin. Start new prescription for atorvastatin40 mg and fenofibrate 145 mg. These has already been sent to your pharmacy. Dr. Tresa EndoKelly also requests  that you  Start taking a 81 mg aspirin daily.   Your physician recommends that you return for lab work fasting in 6-8 weeks.  Your physician recommends that you schedule a follow-up appointment in: 2-3 months

## 2014-10-25 NOTE — Progress Notes (Signed)
Patient ID: Kyle Weiss, male   DOB: 1971-01-05, 43 y.o.   MRN: [id number]     HPI: Kyle Weiss is a 43 y.o. male who presents to the office today for a cardiology evaluation.  He is referred through urgent [facility] through the courtesy of Dr. Janace Hoardavid Hopper for further evaluation of hypertension.  Kyle Weiss has a history of hypertension and in December 2012 was admitted to the hospital with recurrent episodes of chest pain.  His ECG at that time showed poor R-wave progression with T-wave inversion in V5 as well as small inferior Q waves.  In light of his significant cardiac risk factors including marked hyperlipidemia, diabetes mellitus as well as hypertension he underwent cardiac catheterization.  This revealed normal ejection fraction at 55-60% and essentially normal coronary arteries.  At that time, he was started on lisinopril HCTZ for blood pressure control.  He is followed at Urgent Care. A review of his medical chart indicates that two years ago his total cholesterol was 294 with triglycerides of 777.  He tells me that over the years.  He had been changed to just lisinopril.  He had recently run out of his medications.  This week at a clinic for his job.  He was told that his blood pressure was very high.  He presented to urgent care on 10/20/2014 and his blood pressure was 162/106.  I was contacted by Dr. Alwyn RenHopper.  At that time, I recommended reinstitution of his lisinopril HCTZ and also recommended the addition of amlodipine 5 mg.  I scheduled him to undergo a 2-D echo Doppler study in follow-up office visit with me.  The echo study was done on 10/22/2014.  It showed an ejection fraction at 65-70%.  Head normal wall motion.  Doppler parameters were abnormal and consistent with grade 1 diastolic dysfunction.  He had moderate left atrial dilatation.  Presently, he denies chest pain.  He denies palpitations.  He feels better since he was started on the medication several  days ago.  Of note, he also was started on Crestor 20 mg but he did not fill this and in its place, got up prescription for pravastatin 20 mg due to reduce cost.  He also was started back on metformin 500 mg daily.  Blood work from 5 months ago revealed a total cholesterol of 306.  Triglycerides 670.   Past Medical History  Diagnosis Date  . Hypertension   . Diabetes mellitus   . HTN (hypertension) 12/01/2011  . DM (diabetes mellitus) type 2 12/01/2011  . Tobacco abuse 12/01/2011  . Hypokalemia 12/01/2011  . Angina at rest- non cardiac, by cardiac cath 12/01/2011  . Angina at rest- non cardiac, by cardiac cath, 11/30/11, pain resolved 12/01/2011    History reviewed. No pertinent past surgical history.  No Known Allergies  Current Outpatient Prescriptions  Medication Sig Dispense Refill  . amLODipine (NORVASC) 5 MG tablet Take 5 mg by mouth daily.    Marland Kitchen. aspirin 81 MG tablet Take 81 mg by mouth daily.    Marland Kitchen. lisinopril-hydrochlorothiazide (PRINZIDE,ZESTORETIC) 20-25 MG per tablet Take 1 tablet by mouth daily. 30 tablet 12  . metFORMIN (GLUCOPHAGE) 500 MG tablet Take 1 tablet (500 mg total) by mouth daily with breakfast. 30 tablet 12  . atorvastatin (LIPITOR) 40 MG tablet Take 1 tablet (40 mg total) by mouth daily. 90 tablet 3  . fenofibrate 160 MG tablet Take 1 tablet (160 mg total) by mouth daily. 90 tablet 3  No current facility-administered medications for this visit.    History   Social History  . Marital Status: Married    Spouse Name: N/A    Number of Children: N/A  . Years of Education: N/A   Occupational History  . Not on file.   Social History Main Topics  . Smoking status: Current Every Day Smoker    Types: Cigars  . Smokeless tobacco: Never Used  . Alcohol Use: 11.2 oz/week    7 Cans of beer, 14 Not specified per week  . Drug Use: No  . Sexual Activity: Yes   Other Topics Concern  . Not on file   Social History Narrative   Socially he is married.  He  has 3 children.  There is a history of cigar smoking.  He does not use illicit drugs  Family History  Problem Relation Age of Onset  . Hypertension Mother   . Diabetes type II Mother   . Stroke Mother   . Diabetes Mother   . Coronary artery disease Father   . Hypertension Father   . Diabetes type II Father   . Stroke Father   . Coronary artery disease Sister   . Hypertension Sister   . Diabetes type II Sister   . Cancer Brother   . Hypertension Sister   . Diabetes type II Sister   . Hypertension Brother   . Diabetes type II Brother   . Hypertension Brother   . Diabetes type II Brother     ROS General: Negative; No fevers, chills, or night sweats HEENT: Negative; No changes in vision or hearing, sinus congestion, difficulty swallowing Pulmonary: Negative; No cough, wheezing, shortness of breath, hemoptysis Cardiovascular: See HPI: No chest pain, presyncope, syncope, palpatations GI: Negative; No nausea, vomiting, diarrhea, or abdominal pain GU: Negative; No dysuria, hematuria, or difficulty voiding Musculoskeletal: Negative; no myalgias, joint pain, or weakness Hematologic: Negative; no easy bruising, bleeding Endocrine: Positive for diabetes mellitus and marked hyperlipidemia Neuro: Negative; no changes in balance, headaches Skin: Negative; No rashes or skin lesions Psychiatric: Negative; No behavioral problems, depression Sleep: Negative; No snoring,  daytime sleepiness, hypersomnolence, bruxism, restless legs, hypnogognic hallucinations. Other comprehensive 14 point system review is negative   Physical Exam BP 120/90 mmHg  Pulse 96  Ht 5\' 11"  (1.803 m)  Wt 222 lb 9.6 oz (100.971 kg)  BMI 31.06 kg/m2  Repeat blood pressure by me 120/86 General: Alert, oriented, no distress.  Skin: normal turgor, no rashes, warm and dry HEENT: Normocephalic, atraumatic. Pupils equal round and reactive to light; sclera anicteric; extraocular muscles intact, No lid lag; Nose without  nasal septal hypertrophy; Mouth/Parynx benign; Mallinpatti scale 3 Neck: No JVD, no carotid bruits; normal carotid upstroke Lungs: clear to ausculatation and percussion bilaterally; no wheezing or rales, normal inspiratory and expiratory effort Chest wall: without tenderness to palpitation Heart: PMI not displaced, RRR, s1 s2 normal, 1/6 systolic murmur, No diastolic murmur, no rubs, gallops, thrills, or heaves Abdomen: soft, nontender; no hepatosplenomehaly, BS+; abdominal aorta nontender and not dilated by palpation. Back: no CVA tenderness Pulses: 2+  Musculoskeletal: full range of motion, normal strength, no joint deformities Extremities: Pulses 2+, no clubbing cyanosis or edema, Homan's sign negative  Neurologic: grossly nonfocal; Cranial nerves grossly wnl Psychologic: Normal mood and affect   ECG (independently read by me): Sinus rhythm at 96 bpm per T-wave inversion in leads 1 aVL, V5 through V6 , probably representing strain affect  LABS:  BMET    Component Value  Date/Time   NA 138 05/24/2014 0839   K 3.8 05/24/2014 0839   CL 104 05/24/2014 0839   CO2 24 05/24/2014 0839   GLUCOSE 148* 05/24/2014 0839   BUN 13 05/24/2014 0839   CREATININE 0.75 05/24/2014 0839   CREATININE 0.55 01/25/2013 0419   CALCIUM 8.9 05/24/2014 0839   GFRNONAA >89 05/24/2014 0839   GFRNONAA >90 01/25/2013 0419   GFRAA >89 05/24/2014 0839   GFRAA >90 01/25/2013 0419     Hepatic Function Panel     Component Value Date/Time   PROT 8.0 05/24/2014 0839   ALBUMIN 4.8 05/24/2014 0839   AST 47* 05/24/2014 0839   ALT 45 05/24/2014 0839   ALKPHOS 60 05/24/2014 0839   BILITOT 0.6 05/24/2014 0839     CBC    Component Value Date/Time   WBC 5.4 05/24/2014 0922   WBC 7.6 01/25/2013 0419   RBC 4.64* 05/24/2014 0922   RBC 4.49 01/25/2013 0419   HGB 14.3 05/24/2014 0922   HGB 14.2 01/25/2013 0419   HCT 43.7 05/24/2014 0922   HCT 39.5 01/25/2013 0419   PLT 280 01/25/2013 0419   MCV 94.2  05/24/2014 0922   MCV 88.0 01/25/2013 0419   MCH 30.8 05/24/2014 0922   MCH 31.6 01/25/2013 0419   MCHC 32.7 05/24/2014 0922   MCHC 35.9 01/25/2013 0419   RDW 13.8 01/25/2013 0419   LYMPHSABS 2.3 04/12/2010 1540   MONOABS 0.6 04/12/2010 1540   EOSABS 0.0 04/12/2010 1540   BASOSABS 0.0 04/12/2010 1540     BNP    Component Value Date/Time   PROBNP 122.0 12/01/2011 0737    Lipid Panel     Component Value Date/Time   CHOL 306* 05/24/2014 0839   TRIG 670* 05/24/2014 0839   HDL 51 05/24/2014 0839   CHOLHDL 6.0 05/24/2014 0839   VLDL NOT CALC 05/24/2014 0839   LDLCALC NOT CALC 05/24/2014 0839     RADIOLOGY: No results found.    ASSESSMENT AND PLAN: Mr. Malenfant is a 43 year old African-American male who has a history of type 2 diabetes mellitus, severe hyperlipidemia, family history for CAD, as well as hypertension, recently stage II.  Over the past several days.  He has been reinstituted with lisinopril HCTZ 20/25 mg as well as was started on amlodipine 5 mg.  His blood pressure today is significantly improved.  His echo Doppler study does show normal systolic function with moderate concentric left ventricular hypertrophy.  His ECG shows probable strain effect leading to ECG T-wave abnormalities.  He grade 1 diastolic dysfunction and moderate left atrial dilatation.  His lipids are significantly abnormal.  I have recommended he discontinue pravastatin.  He was unable to afford Crestor.  I will start him on atorvastatin 40 mg which is a dose equivalent to Crestor 20 mg.  With his marked hypertriglyceridemia.  I'm also adding fenofibrate 145 or 160 mg depending upon the generic available.  He may also benefit from fish oil.  I recommended aspirin 81 mg.  Laboratory will be obtained in approximately 6 weeks with a chemistry profile, lipid panel and TSH.  I will see him in 2-3 months for follow-up evaluation.   Time spent: 30 minutes  Lennette Bihari, MD, [facility]  10/26/2014 7:54  PM

## 2014-10-26 ENCOUNTER — Telehealth: Payer: Self-pay | Admitting: *Deleted

## 2014-10-26 ENCOUNTER — Other Ambulatory Visit: Payer: Self-pay | Admitting: *Deleted

## 2014-10-26 DIAGNOSIS — E782 Mixed hyperlipidemia: Secondary | ICD-10-CM | POA: Insufficient documentation

## 2014-10-26 MED ORDER — FENOFIBRATE 160 MG PO TABS: 160.0000 mg | ORAL_TABLET | Freq: Every day | ORAL | Status: DC

## 2014-10-26 NOTE — Telephone Encounter (Signed)
Received a PA request for the patient's fenofibrate 145mg . Per Dr. Tresa EndoKelly change to fenofibrate 160 mg since this was one of the alternative drugs. New prescription for the fenofibrate 160 mg  E-scribed to the patient's mail order pharmacy.

## 2014-11-02 ENCOUNTER — Telehealth: Payer: Self-pay | Admitting: Cardiovascular Disease

## 2014-11-02 NOTE — Telephone Encounter (Addendum)
Patient came in office on 10/25/2014 he brought a physician statement form to get fill out and he stated that [facility] and that they was suppose to send it over here we never received it. I had him to fill out a release form and he said once we received the other paperwork to see how many forms it would be before he would bring the money back here.  11/01/2014 I sent the release of information form to [facility] office in the interoffice envelope.  11/02/2014 I called Eber Jonesarolyn at the [facility] office to see if maybe they sent them to her and she confirmed that she received an FMLA form there, I then tried to call patient to let him know that they [facility] them [facility] with no response his voicemail was full.  11/09/2014 Tried to call patient again left message for him to call me to see what he want us to do about this FMLA form.  Later this day he returned my call and said that he was already back at work and he didn't think he needed these forms. cbr

## 2014-11-10 ENCOUNTER — Encounter (HOSPITAL_COMMUNITY): Payer: Self-pay | Admitting: Cardiovascular Disease

## 2014-12-21 ENCOUNTER — Other Ambulatory Visit: Payer: Self-pay | Admitting: *Deleted

## 2014-12-21 MED ORDER — FENOFIBRATE 160 MG PO TABS: 160.0000 mg | ORAL_TABLET | Freq: Every day | ORAL | Status: DC

## 2014-12-21 NOTE — Telephone Encounter (Signed)
Pharmacy fax requesting approved dosage. 160mg  on list, this is already on file as being e-scripted in November.

## 2015-01-25 ENCOUNTER — Ambulatory Visit: Payer: 59 | Admitting: Cardiovascular Disease

## 2015-07-11 ENCOUNTER — Telehealth: Payer: Self-pay

## 2015-07-11 ENCOUNTER — Ambulatory Visit (INDEPENDENT_AMBULATORY_CARE_PROVIDER_SITE_OTHER): Payer: 59 | Admitting: Urgent Care

## 2015-07-11 VITALS — BP 146/72 | HR 101 | Temp 98.6°F | Resp 14 | Ht 71.0 in | Wt 211.0 lb

## 2015-07-11 DIAGNOSIS — J029 Acute pharyngitis, unspecified: Secondary | ICD-10-CM

## 2015-07-11 DIAGNOSIS — J020 Streptococcal pharyngitis: Secondary | ICD-10-CM

## 2015-07-11 LAB — POCT RAPID STREP A (OFFICE): Rapid Strep A Screen: POSITIVE — AB

## 2015-07-11 MED ORDER — HYDROCODONE-HOMATROPINE 5-1.5 MG/5ML PO SYRP: 5.0000 mL | ORAL_SOLUTION | Freq: Every evening | ORAL | Status: DC | PRN

## 2015-07-11 MED ORDER — AMOXICILLIN 875 MG PO TABS: 875.0000 mg | ORAL_TABLET | Freq: Two times a day (BID) | ORAL | Status: DC

## 2015-07-11 NOTE — Patient Instructions (Signed)

## 2015-07-11 NOTE — Telephone Encounter (Signed)
Pt called. We sent his abx to [facility]. Wants to know if we could call it in to [facility] on [address] and [place]. Done

## 2015-07-11 NOTE — Progress Notes (Signed)
    MRN: [id number] DOB: 18-Jan-1971  Subjective:   Kyle Weiss is a 44 y.o. male presenting for chief complaint of Sore Throat  Reports 2 day history of sore throat, slight dry cough. Has tried chloraseptic spray, salt water garggle with minimal relief. Denies fever, eye pain, retinitis, ear pain, ear drainage, tooth pain, chest pain, chest tightness, shortness of breath, nausea, vomiting, abdominal pain. Denies any sick contacts. Denies any other aggravating or relieving factors, no other questions or concerns.  Kyle Weiss has a current medication list which includes the following prescription(s): amlodipine, aspirin, lisinopril-hydrochlorothiazide, and metformin. Kyle Weiss has No Known Allergies.  Kyle Weiss  has a past medical history of Hypertension; Diabetes mellitus; HTN (hypertension) (12/01/2011); DM (diabetes mellitus) type 2 (12/01/2011); Tobacco abuse (12/01/2011); Hypokalemia (12/01/2011); Angina at rest- non cardiac, by cardiac cath (12/01/2011); and Angina at rest- non cardiac, by cardiac cath, 11/30/11, pain resolved (12/01/2011). Also  has past surgical history that includes left heart catheterization with coronary angiogram (N/A, 12/03/2011).  ROS As in subjective.  Objective:   Vitals: BP 146/72 mmHg  Pulse 101  Temp(Src) 98.6 F (37 C) (Oral)  Resp 14  Ht  (1.803 m)  Wt 211 lb (95.709 kg)  BMI 29.44 kg/m2  SpO2 98%  Physical Exam  Constitutional: Kyle Weiss is oriented to person, place, and time. Kyle Weiss appears well-developed and well-nourished.  HENT:  TM's intact bilaterally, no effusions or erythema. Nasal turbinates pink and moist, nasal passages patent and without sinus tenderness. Tonsils enlarged and there is tenderness but without exudates or abscesses.  Eyes: Conjunctivae are normal. Right eye exhibits no discharge. Left eye exhibits no discharge. No scleral icterus.  Cardiovascular: Normal rate, regular rhythm and intact distal pulses.  Exam reveals no gallop and no  friction rub.   No murmur heard. Pulmonary/Chest: No stridor. No respiratory distress. Kyle Weiss has no wheezes. Kyle Weiss has no rales.  Lymphadenopathy:    Kyle Weiss has cervical adenopathy (anterior, left sided).  Neurological: Kyle Weiss is alert and oriented to person, place, and time.  Skin: Skin is warm and dry. No rash noted. No erythema. No pallor.    Results for orders placed or performed in visit on 07/11/15 (from the past 24 hour(s))  POCT rapid strep A     Status: Abnormal   Collection Time: 07/11/15  1:05 PM  Result Value Ref Range   Rapid Strep A Screen Positive (A) Negative    Assessment and Plan :   1. Sore throat 2. Strep throat - Start amoxicillin, hycodan for symptomatic relief. Return to clinic if symptoms do not resolve despite antibiotic course.  Wallis Bamberg, PA-C [facility] [phone] 07/11/2015 12:49 PM

## 2015-10-24 ENCOUNTER — Other Ambulatory Visit: Payer: Self-pay | Admitting: Family Medicine

## 2015-11-15 ENCOUNTER — Emergency Department (HOSPITAL_COMMUNITY)
Admission: EM | Admit: 2015-11-15 | Discharge: 2015-11-15 | Disposition: A | Discharge status: Home / Self Care | Payer: Self-pay | Attending: Emergency Medicine | Admitting: Emergency Medicine

## 2015-11-15 ENCOUNTER — Emergency Department (HOSPITAL_COMMUNITY): Payer: Self-pay

## 2015-11-15 ENCOUNTER — Encounter (HOSPITAL_COMMUNITY): Payer: Self-pay | Admitting: Emergency Medicine

## 2015-11-15 DIAGNOSIS — Z7982 Long term (current) use of aspirin: Secondary | ICD-10-CM | POA: Insufficient documentation

## 2015-11-15 DIAGNOSIS — F17210 Nicotine dependence, cigarettes, uncomplicated: Secondary | ICD-10-CM | POA: Insufficient documentation

## 2015-11-15 DIAGNOSIS — M25571 Pain in right ankle and joints of right foot: Secondary | ICD-10-CM | POA: Insufficient documentation

## 2015-11-15 DIAGNOSIS — Z79899 Other long term (current) drug therapy: Secondary | ICD-10-CM | POA: Insufficient documentation

## 2015-11-15 DIAGNOSIS — I10 Essential (primary) hypertension: Secondary | ICD-10-CM | POA: Insufficient documentation

## 2015-11-15 DIAGNOSIS — E119 Type 2 diabetes mellitus without complications: Secondary | ICD-10-CM | POA: Insufficient documentation

## 2015-11-15 DIAGNOSIS — R600 Localized edema: Secondary | ICD-10-CM | POA: Insufficient documentation

## 2015-11-15 DIAGNOSIS — Z9889 Other specified postprocedural states: Secondary | ICD-10-CM | POA: Insufficient documentation

## 2015-11-15 MED ORDER — LIDOCAINE-EPINEPHRINE 1 %-1:100000 IJ SOLN
20.0000 mL | Freq: Once | INTRAMUSCULAR | Status: AC
  Administered 2015-11-15: 1 mL via INTRADERMAL
  Filled 2015-11-15: qty 1

## 2015-11-15 MED ORDER — ACETAMINOPHEN 500 MG PO TABS
1000.0000 mg | ORAL_TABLET | Freq: Once | ORAL | Status: AC
  Administered 2015-11-15: 1000 mg via ORAL
  Filled 2015-11-15: qty 2

## 2015-11-15 MED ORDER — OXYCODONE HCL 5 MG PO TABS
5.0000 mg | ORAL_TABLET | Freq: Once | ORAL | Status: AC
  Administered 2015-11-15: 5 mg via ORAL
  Filled 2015-11-15: qty 1

## 2015-11-15 MED ORDER — IBUPROFEN 800 MG PO TABS
800.0000 mg | ORAL_TABLET | Freq: Once | ORAL | Status: AC
  Administered 2015-11-15: 800 mg via ORAL
  Filled 2015-11-15: qty 1

## 2015-11-15 MED ORDER — INDOMETHACIN 25 MG PO CAPS: 25.0000 mg | ORAL_CAPSULE | Freq: Three times a day (TID) | ORAL | Status: AC | PRN

## 2015-11-15 NOTE — Discharge Instructions (Signed)
Tylenol 2 tabs po q4h prn.  You can take this on top of your indomethacin or motrin.  Take 4 over the counter ibuprofen tablets 3 times a day or 2 over-the-counter naproxen tablets twice a day for pain.  Return to the emergency department for redness fever chills  Ankle Pain Ankle pain is a common symptom. The bones, cartilage, tendons, and muscles of the ankle joint perform a lot of work each day. The ankle joint holds your body weight and allows you to move around. Ankle pain can occur on either side or back of 1 or both ankles. Ankle pain may be sharp and burning or dull and aching. There may be tenderness, stiffness, redness, or warmth around the ankle. The pain occurs more often when a person walks or puts pressure on the ankle. CAUSES  There are many reasons ankle pain can develop. It is important to work with your caregiver to identify the cause since many conditions can impact the bones, cartilage, muscles, and tendons. Causes for ankle pain include:  Injury, including a break (fracture), sprain, or strain often due to a fall, sports, or a high-impact activity.  Swelling (inflammation) of a tendon (tendonitis).  Achilles tendon rupture.  Ankle instability after repeated sprains and strains.  Poor foot alignment.  Pressure on a nerve (tarsal tunnel syndrome).  Arthritis in the ankle or the lining of the ankle.  Crystal formation in the ankle (gout or pseudogout). DIAGNOSIS  A diagnosis is based on your medical history, your symptoms, results of your physical exam, and results of diagnostic tests. Diagnostic tests may include X-ray exams or a computerized magnetic scan (magnetic resonance imaging, MRI). TREATMENT  Treatment will depend on the cause of your ankle pain and may include:  Keeping pressure off the ankle and limiting activities.  Using crutches or other walking support (a cane or brace).  Using rest, ice, compression, and elevation.  Participating in physical  therapy or home exercises.  Wearing shoe inserts or special shoes.  Losing weight.  Taking medications to reduce pain or swelling or receiving an injection.  Undergoing surgery. HOME CARE INSTRUCTIONS   Only take over-the-counter or prescription medicines for pain, discomfort, or fever as directed by your caregiver.  Put ice on the injured area.  Put ice in a plastic bag.  Place a towel between your skin and the bag.  Leave the ice on for 15-20 minutes at a time, 03-04 times a day.  Keep your leg raised (elevated) when possible to lessen swelling.  Avoid activities that cause ankle pain.  Follow specific exercises as directed by your caregiver.  Record how often you have ankle pain, the location of the pain, and what it feels like. This information may be helpful to you and your caregiver.  Ask your caregiver about returning to work or sports and whether you should drive.  Follow up with your caregiver for further examination, therapy, or testing as directed. SEEK MEDICAL CARE IF:   Pain or swelling continues or worsens beyond 1 week.  You have an oral temperature above 102 F (38.9 C).  You are feeling unwell or have chills.  You are having an increasingly difficult time with walking.  You have loss of sensation or other new symptoms.  You have questions or concerns. MAKE SURE YOU:   Understand these instructions.  Will watch your condition.  Will get help right away if you are not doing well or get worse.   This information is not intended  to replace advice given to you by your health care provider. Make sure you discuss any questions you have with your health care provider.   Document Released: 05/09/2010 Document Revised: 02/11/2012 Document Reviewed: 06/21/2015 Elsevier Interactive Patient Education [employer].

## 2015-11-15 NOTE — ED Notes (Signed)
Pt c/o right foot and ankle pain and swelling for a few weeks. Says it "started with the left foot, I thought it was a a bunion but now it's the right. I took some Motrin thinking if the swelling down the pain would go down." Unable to bear weight on the right ankle and foot. Denies recent trauma/injury/past surgery on affected foot/ankle and denies recent insect bite/etc. No other c/c. No hx gout.

## 2015-11-15 NOTE — ED Provider Notes (Signed)
CSN: [id number]     Arrival date & time 11/15/15  7829 History   First MD Initiated Contact with Patient 11/15/15 0805     Chief Complaint  Patient presents with  . Joint Swelling  . Foot Pain     (Consider location/radiation/quality/duration/timing/severity/associated sxs/prior Treatment) Patient is a 44 y.o. male presenting with ankle pain. The history is provided by the patient.  Ankle Pain Location:  Ankle Time since incident:  1 week Ankle location:  R ankle Pain details:    Quality:  Aching   Radiates to:  Does not radiate   Severity:  Moderate   Onset quality:  Gradual   Duration:  1 week   Timing:  Constant   Progression:  Worsening Chronicity:  New Dislocation: no   Foreign body present:  No foreign bodies Prior injury to area:  No Relieved by:  Nothing Worsened by:  Nothing tried Ineffective treatments:  None tried Associated symptoms: decreased ROM   Associated symptoms: no fever     44 yo M with a chief complaint of right ankle pain. This been going on for about a week. Start in the toe and now moved to the ankle. Patient denies any break in the skin. Patient denies fevers chills vomiting. Patient denies any injury to the area. Moving it makes a lot worse. Patient has tried ibuprofen without improvement.  Past Medical History  Diagnosis Date  . Hypertension   . Diabetes mellitus   . HTN (hypertension) 12/01/2011  . DM (diabetes mellitus) type 2 12/01/2011  . Tobacco abuse 12/01/2011  . Hypokalemia 12/01/2011  . Angina at rest- non cardiac, by cardiac cath 12/01/2011  . Angina at rest- non cardiac, by cardiac cath, 11/30/11, pain resolved 12/01/2011   Past Surgical History  Procedure Laterality Date  . Left heart catheterization with coronary angiogram N/A 12/03/2011    Procedure: LEFT HEART CATHETERIZATION WITH CORONARY ANGIOGRAM;  Surgeon: Lennette Bihari, MD;  Location: [facility] CATH LAB;  Service: Cardiovascular;  Laterality: N/A;   Family History   Problem Relation Age of Onset  . Hypertension Mother   . Diabetes type II Mother   . Stroke Mother   . Diabetes Mother   . Coronary artery disease Father   . Hypertension Father   . Diabetes type II Father   . Stroke Father   . Coronary artery disease Sister   . Hypertension Sister   . Diabetes type II Sister   . Cancer Brother   . Hypertension Sister   . Diabetes type II Sister   . Hypertension Brother   . Diabetes type II Brother   . Hypertension Brother   . Diabetes type II Brother    Social History  Substance Use Topics  . Smoking status: Current Every Day Smoker    Types: Cigars  . Smokeless tobacco: Never Used  . Alcohol Use: 11.2 oz/week    7 Cans of beer, 14 Standard drinks or equivalent per week    Review of Systems  Constitutional: Negative for fever and chills.  HENT: Negative for congestion and facial swelling.   Eyes: Negative for discharge and visual disturbance.  Respiratory: Negative for shortness of breath.   Cardiovascular: Negative for chest pain and palpitations.  Gastrointestinal: Negative for vomiting, abdominal pain and diarrhea.  Musculoskeletal: Positive for arthralgias. Negative for myalgias.  Skin: Negative for color change and rash.  Neurological: Negative for tremors, syncope and headaches.  Psychiatric/Behavioral: Negative for confusion and dysphoric mood.  Allergies  Review of patient's allergies indicates no known allergies.  Home Medications   Prior to Admission medications   Medication Sig Start Date End Date Taking? Authorizing Provider  aspirin 81 MG tablet Take 81 mg by mouth daily.   Yes Historical Provider, MD  ibuprofen (ADVIL,MOTRIN) 200 MG tablet Take 400 mg by mouth every 6 (six) hours as needed for moderate pain.   Yes Historical Provider, MD  lisinopril-hydrochlorothiazide (PRINZIDE,ZESTORETIC) 20-25 MG tablet Take 1 tablet by mouth daily. PATIENT NEEDS OFFICE VISIT FOR ADDITIONAL REFILLS 10/25/15  Yes Wallis Bamberg,  PA-C  metFORMIN (GLUCOPHAGE) 500 MG tablet Take 1 tablet (500 mg total) by mouth daily. PATIENT NEEDS OFFICE VISIT FOR ADDITIONAL REFILLS 10/25/15  Yes Wallis Bamberg, PA-C  amoxicillin (AMOXIL) 875 MG tablet Take 1 tablet (875 mg total) by mouth 2 (two) times daily. Patient not taking: Reported on 11/15/2015 07/11/15   Wallis Bamberg, PA-C  HYDROcodone-homatropine [facility]) 5-1.5 MG/5ML syrup Take 5 mLs by mouth at bedtime as needed. Patient not taking: Reported on 11/15/2015 07/11/15   Wallis Bamberg, PA-C  indomethacin (INDOCIN) 25 MG capsule Take 1 capsule (25 mg total) by mouth 3 (three) times daily as needed. 11/15/15   Melene Plan, DO   BP 141/92 mmHg  Pulse 97  Temp(Src) 98.5 F (36.9 C) (Oral)  Resp 20  SpO2 97% Physical Exam  Constitutional: He is oriented to person, place, and time. He appears well-developed and well-nourished.  HENT:  Head: Normocephalic and atraumatic.  Eyes: EOM are normal. Pupils are equal, round, and reactive to light.  Neck: Normal range of motion. Neck supple. No JVD present.  Cardiovascular: Normal rate and regular rhythm.  Exam reveals no gallop and no friction rub.   No murmur heard. Pulmonary/Chest: No respiratory distress. He has no wheezes.  Abdominal: He exhibits no distension. There is no tenderness. There is no rebound and no guarding.  Musculoskeletal: Normal range of motion. He exhibits edema and tenderness.  Tenderness and swelling about the first MTP as well as the right ankle. Patient is unable to flex or extend the ankle without significant tenderness. Mild warmth to the area compared to the other side. No noted erythema. Pulse motor and sensation intact distally.  Neurological: He is alert and oriented to person, place, and time.  Skin: No rash noted. No pallor.  Psychiatric: He has a normal mood and affect. His behavior is normal.  Nursing note and vitals reviewed.   ED Course  .Joint Aspiration/Arthrocentesis Date/Time: 11/15/2015 9:28  AM Performed by: Melene Plan Authorized by: Melene Plan Consent: Verbal consent obtained. Risks and benefits: risks, benefits and alternatives were discussed Consent given by: patient Required items: required blood products, implants, devices, and special equipment available Patient identity confirmed: verbally with patient Time out: Immediately prior to procedure a "time out" was called to verify the correct patient, procedure, equipment, support staff and site/side marked as required. Indications: joint swelling,  pain,  possible septic joint and diagnostic evaluation  Body area: ankle Joint: right ankle Local anesthesia used: yes Anesthesia: local infiltration Local anesthetic: lidocaine 1% with epinephrine Anesthetic total: 4 ml Patient sedated: no Preparation: Patient was prepped and draped in the usual sterile fashion. Needle gauge: 18 G Ultrasound guidance: no Approach: medial Aspirate amount: 0 mL Patient tolerance: Patient tolerated the procedure well with no immediate complications Comments: Dry tap.  Needle was fully hubbed into the joint without return.     (including critical care time) Labs Review Labs Reviewed - No data  to display  Imaging Review Dg Ankle 2 Views Right  11/15/2015  CLINICAL DATA:  Pain and swelling for 2 days EXAM: RIGHT ANKLE - 2 VIEW COMPARISON:  None. FINDINGS: Frontal and lateral views were obtained. There is mild generalized soft tissue swelling. There is a small ankle joint effusion. No fracture evident. The ankle mortise appears intact. There are spurs arising from the posterior and inferior calcaneus. There is no appreciable joint space narrowing. IMPRESSION: Mild soft tissue swelling with small joint effusion. These findings potentially could represent ligamentous injury. No fracture evident. Ankle mortise appears intact. There are calcaneal spurs. Electronically Signed   By: Bretta BangWilliam  Woodruff III M.D.   On: 11/15/2015 08:56   I have personally  reviewed and evaluated these images and lab results as part of my medical decision-making.   EKG Interpretation None      MDM   Final diagnoses:  Right ankle pain    44 yo M with a chief complaint of right ankle pain. Area is mildly warm and swollen compared to the other side. Patient with significant pain when trying to move the ankle. Symptoms initially started in the first MTP. Likely gout by history. Unable to differentiate from septic arthritis will attempt arthrocentesis.  Dry tap, will treat as gout, PCP follow up, return precautions given.   9:30 AM:  I have discussed the diagnosis/risks/treatment options with the patient and family and believe the pt to be eligible for discharge home to follow-up with PCP. We also discussed returning to the ED immediately if new or worsening sx occur. We discussed the sx which are most concerning (e.g., sudden worsening pain, fever, nausea, vomiting, spreading redness) that necessitate immediate return. Medications administered to the patient during their visit and any new prescriptions provided to the patient are listed below.  Medications given during this visit Medications  lidocaine-EPINEPHrine (XYLOCAINE W/EPI) 1 %-1:100000 (with pres) injection 20 mL (1 mL Intradermal Given 11/15/15 0840)  acetaminophen (TYLENOL) tablet 1,000 mg (1,000 mg Oral Given 11/15/15 0838)  ibuprofen (ADVIL,MOTRIN) tablet 800 mg (800 mg Oral Given 11/15/15 0838)  oxyCODONE (Oxy IR/ROXICODONE) immediate release tablet 5 mg (5 mg Oral Given 11/15/15 0838)    New Prescriptions   INDOMETHACIN (INDOCIN) 25 MG CAPSULE    Take 1 capsule (25 mg total) by mouth 3 (three) times daily as needed.    The patient appears reasonably screen and/or stabilized for discharge and I doubt any other medical condition or other [facility] requiring further screening, evaluation, or treatment in the ED at this time prior to discharge.    Melene Planan Camry Robello, DO 11/15/15 0930

## 2016-01-06 ENCOUNTER — Telehealth: Payer: Self-pay | Admitting: Family Medicine

## 2016-01-06 NOTE — Telephone Encounter (Signed)
Pt's spouse called req. Refill of blood pressure rxs (unsure of rx names); last OV was 07/2015  Pharm: Please call because pt is on the road traveling for work   [phone]/ [phone]  Spouse

## 2016-01-09 MED ORDER — METFORMIN HCL 500 MG PO TABS: 500.0000 mg | ORAL_TABLET | Freq: Every day | ORAL | Status: AC

## 2016-01-09 MED ORDER — LISINOPRIL-HYDROCHLOROTHIAZIDE 20-25 MG PO TABS: 1.0000 | ORAL_TABLET | Freq: Every day | ORAL | Status: AC

## 2016-01-09 NOTE — Addendum Note (Signed)
Addended by: Sheppard Plumber A on: 01/09/2016 12:06 PM   Modules accepted: Orders

## 2016-01-09 NOTE — Telephone Encounter (Signed)
Spoke to pt who agreed to RTC on Sat when he gets back into town. I sent in #7 of both lisinopril and metformin for him to out of town pharm to cover him until then.

## 2016-08-19 ENCOUNTER — Emergency Department (HOSPITAL_COMMUNITY)
Admission: EM | Admit: 2016-08-19 | Discharge: 2016-08-19 | Disposition: A | Discharge status: Home / Self Care | Payer: Self-pay | Attending: Emergency Medicine | Admitting: Emergency Medicine

## 2016-08-19 ENCOUNTER — Encounter (HOSPITAL_COMMUNITY): Payer: Self-pay | Admitting: Radiology

## 2016-08-19 ENCOUNTER — Emergency Department (HOSPITAL_COMMUNITY): Payer: Self-pay

## 2016-08-19 DIAGNOSIS — S0083XA Contusion of other part of head, initial encounter: Secondary | ICD-10-CM

## 2016-08-19 DIAGNOSIS — Y999 Unspecified external cause status: Secondary | ICD-10-CM | POA: Insufficient documentation

## 2016-08-19 DIAGNOSIS — S00511A Abrasion of lip, initial encounter: Secondary | ICD-10-CM | POA: Insufficient documentation

## 2016-08-19 DIAGNOSIS — Z955 Presence of coronary angioplasty implant and graft: Secondary | ICD-10-CM | POA: Insufficient documentation

## 2016-08-19 DIAGNOSIS — Y939 Activity, unspecified: Secondary | ICD-10-CM | POA: Insufficient documentation

## 2016-08-19 DIAGNOSIS — F17210 Nicotine dependence, cigarettes, uncomplicated: Secondary | ICD-10-CM | POA: Insufficient documentation

## 2016-08-19 DIAGNOSIS — R4781 Slurred speech: Secondary | ICD-10-CM | POA: Insufficient documentation

## 2016-08-19 DIAGNOSIS — I10 Essential (primary) hypertension: Secondary | ICD-10-CM | POA: Insufficient documentation

## 2016-08-19 DIAGNOSIS — S0031XA Abrasion of nose, initial encounter: Secondary | ICD-10-CM | POA: Insufficient documentation

## 2016-08-19 DIAGNOSIS — S0081XA Abrasion of other part of head, initial encounter: Secondary | ICD-10-CM

## 2016-08-19 DIAGNOSIS — Y92410 Unspecified street and highway as the place of occurrence of the external cause: Secondary | ICD-10-CM | POA: Insufficient documentation

## 2016-08-19 DIAGNOSIS — Z23 Encounter for immunization: Secondary | ICD-10-CM | POA: Insufficient documentation

## 2016-08-19 DIAGNOSIS — E119 Type 2 diabetes mellitus without complications: Secondary | ICD-10-CM | POA: Insufficient documentation

## 2016-08-19 DIAGNOSIS — Z7984 Long term (current) use of oral hypoglycemic drugs: Secondary | ICD-10-CM | POA: Insufficient documentation

## 2016-08-19 DIAGNOSIS — Z7982 Long term (current) use of aspirin: Secondary | ICD-10-CM | POA: Insufficient documentation

## 2016-08-19 MED ORDER — TETANUS-DIPHTH-ACELL PERTUSSIS 5-2.5-18.5 LF-MCG/0.5 IM SUSP
0.5000 mL | Freq: Once | INTRAMUSCULAR | Status: AC
  Administered 2016-08-19: 0.5 mL via INTRAMUSCULAR
  Filled 2016-08-19: qty 0.5
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
## 2016-08-19 NOTE — ED Notes (Signed)
Patient transported to X-ray 

## 2016-08-19 NOTE — Discharge Instructions (Signed)
Do not drive after drinking alcohol.
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
## 2016-08-19 NOTE — ED Notes (Signed)
Dr. Glick at bedside.  

## 2017-01-30 ENCOUNTER — Ambulatory Visit: Payer: Self-pay

## 2017-01-30 ENCOUNTER — Other Ambulatory Visit: Payer: Self-pay | Admitting: Occupational Medicine

## 2017-01-30 DIAGNOSIS — M25562 Pain in left knee: Secondary | ICD-10-CM

## 2017-09-28 IMAGING — CR DG KNEE COMPLETE 4+V*L*
5 series · 5 of 5 positions shown · non-contrast
Comparison: None in PACs

CLINICAL DATA: Status post fall 2 days ago with medial knee pain.

EXAM:
LEFT KNEE - COMPLETE 4+ VIEW

[view not recorded (1 of 5)]
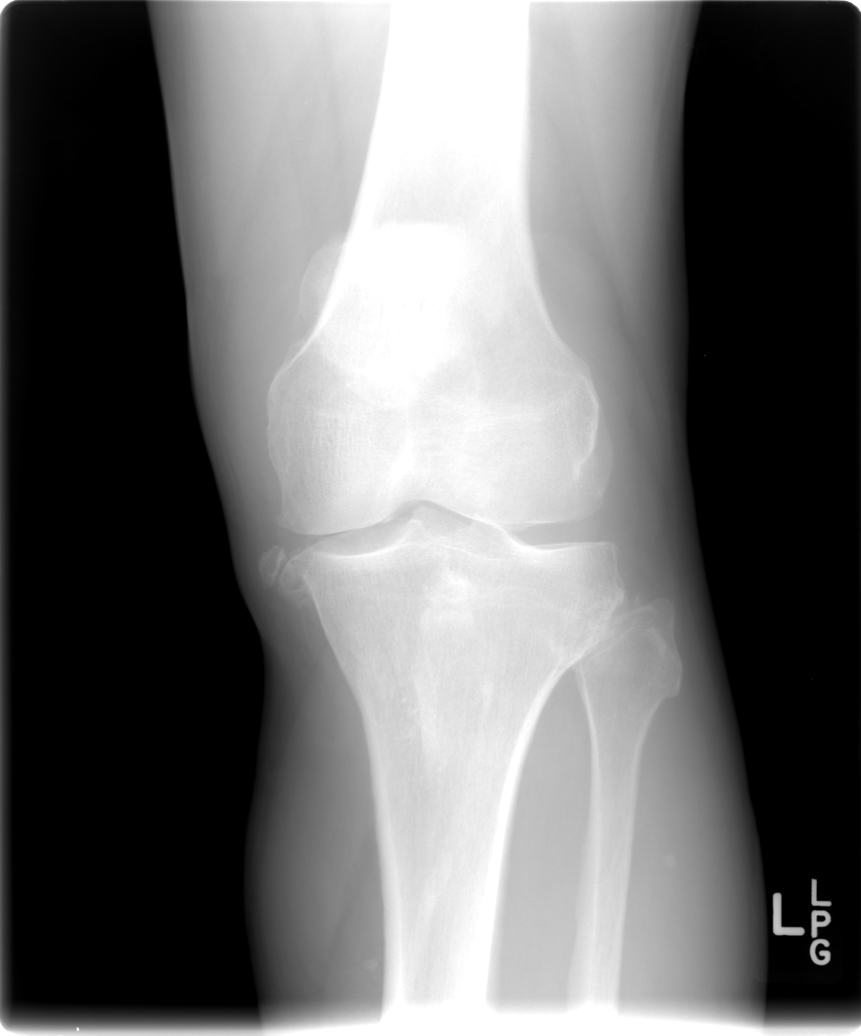

[view not recorded (2 of 5)]
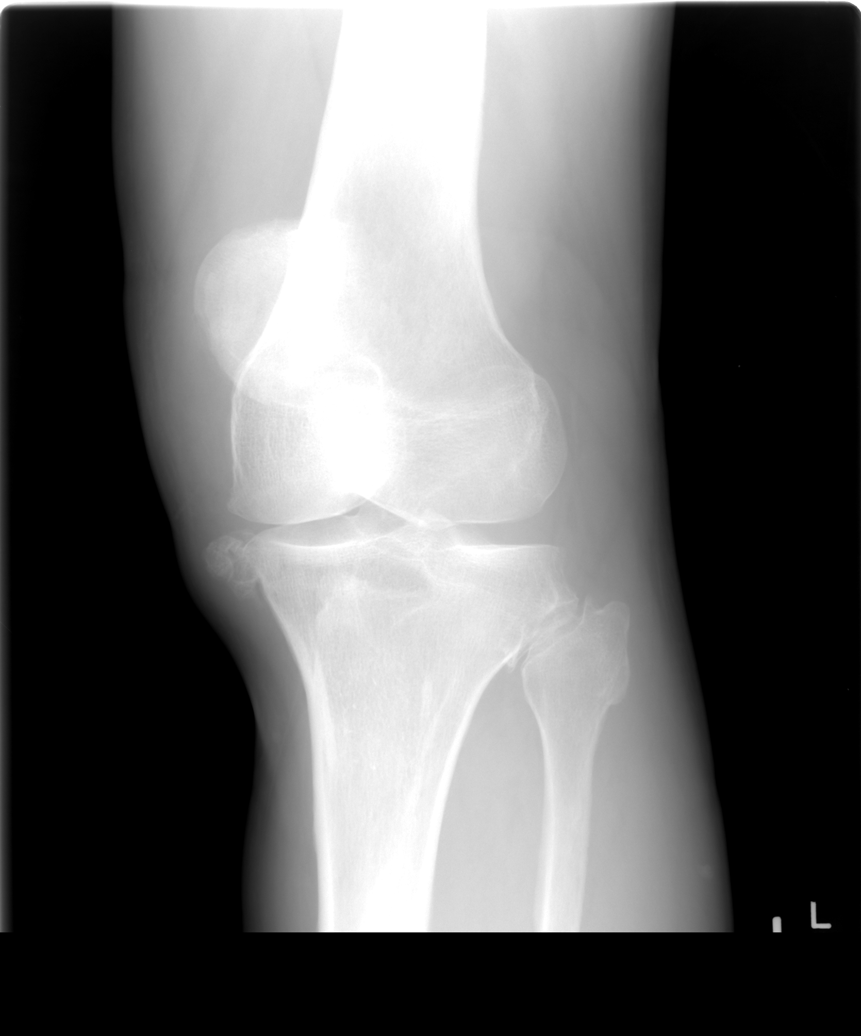

[view not recorded (3 of 5)]
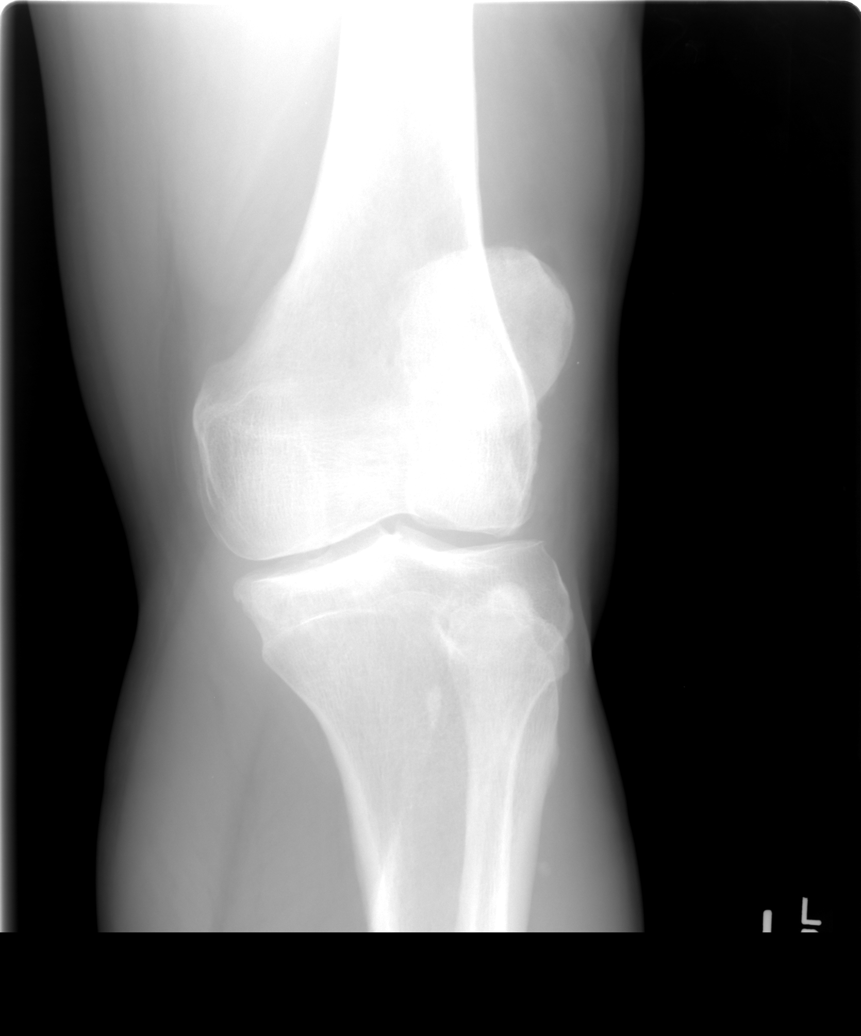

[view not recorded (4 of 5)]
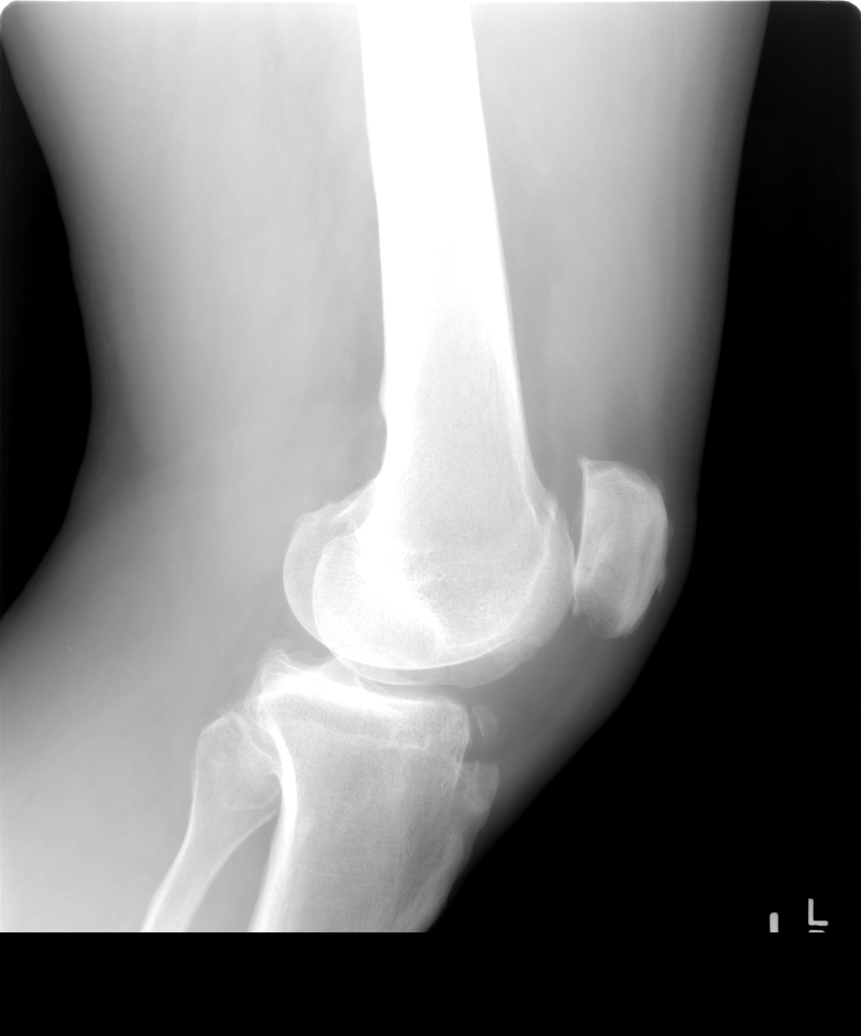

[view not recorded (5 of 5)]
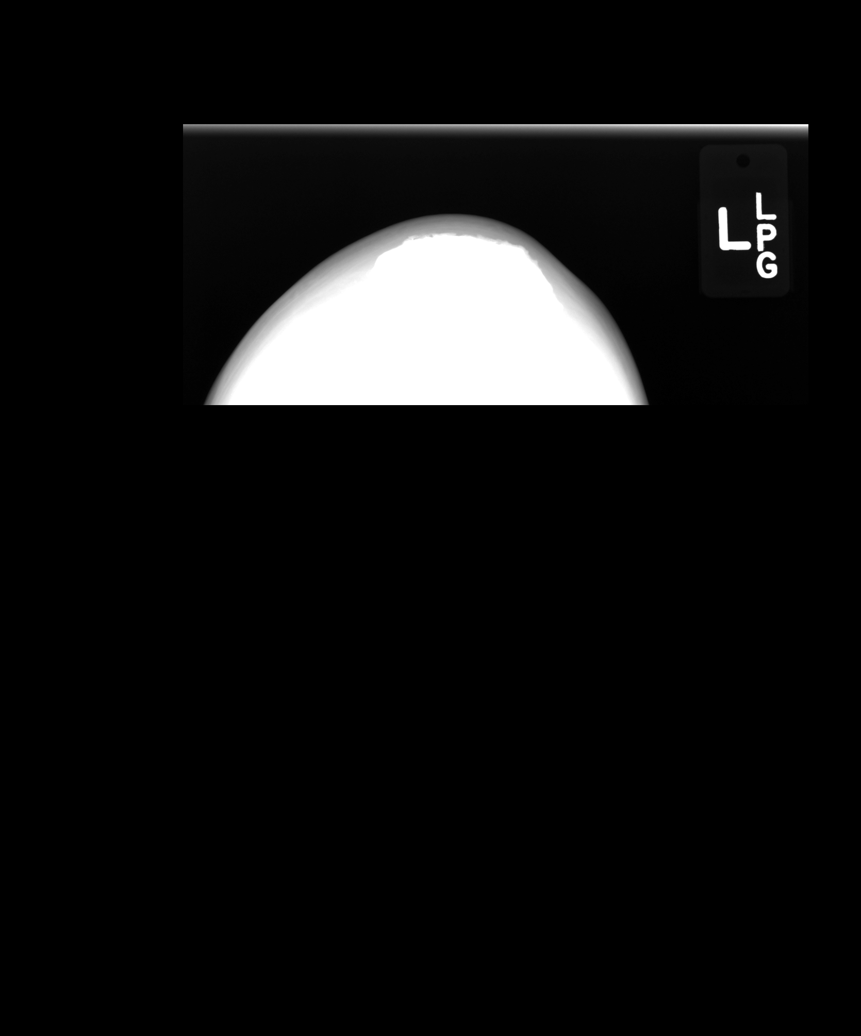

[5 of 5 positions shown; findings below may reference images not displayed]

FINDINGS: The bones are subjectively adequately mineralized. There is a large
osteophyte arising from the periphery of the medial femoral condyle.
There is a well corticated lucency extending through this which
suggests that an accessory ossicle is present rather than avulsion
fracture. The joint spaces are well maintained. There is beaking of
the tibial spines. Spurs arise from the articular margins of the
patella. There is a suprapatellar effusion. There is mild
degenerative change of the tibiofibular articulation.
IMPRESSION: There is no acute fracture nor dislocation. There is mild
osteoarthritic spurring as described. There is no significant joint
space loss. Moderate-sized suprapatellar effusion.
